# Patient Record
Sex: Female | Born: 1965 | Race: Black or African American | Hispanic: No | Marital: Married | Smoking: Former smoker
Health system: Southern US, Community
[De-identification: ages and names within clinical notes are randomized; demographics above are authoritative.]

## PROBLEM LIST (undated history)

## (undated) DIAGNOSIS — F419 Anxiety disorder, unspecified: Secondary | ICD-10-CM

## (undated) DIAGNOSIS — I499 Cardiac arrhythmia, unspecified: Secondary | ICD-10-CM

## (undated) DIAGNOSIS — R51 Headache: Secondary | ICD-10-CM

## (undated) DIAGNOSIS — R112 Nausea with vomiting, unspecified: Secondary | ICD-10-CM

## (undated) DIAGNOSIS — B009 Herpesviral infection, unspecified: Secondary | ICD-10-CM

## (undated) DIAGNOSIS — D649 Anemia, unspecified: Secondary | ICD-10-CM

## (undated) DIAGNOSIS — Z9889 Other specified postprocedural states: Secondary | ICD-10-CM

## (undated) DIAGNOSIS — N83201 Unspecified ovarian cyst, right side: Secondary | ICD-10-CM

## (undated) DIAGNOSIS — K219 Gastro-esophageal reflux disease without esophagitis: Secondary | ICD-10-CM

## (undated) HISTORY — PX: UNILATERAL SALPINGECTOMY: SHX6160

## (undated) HISTORY — PX: TUBAL LIGATION: SHX77

## (undated) HISTORY — PX: OTHER SURGICAL HISTORY: SHX169

## (undated) HISTORY — PX: ABLATION: SHX5711

## (undated) HISTORY — PX: LEFT OOPHORECTOMY: SHX1961

## (undated) HISTORY — PX: DIAGNOSTIC LAPAROSCOPY: SUR761

## (undated) HISTORY — PX: MYOMECTOMY: SHX85

---

## 1999-02-14 ENCOUNTER — Other Ambulatory Visit: Admission: RE | Admit: 1999-02-14 | Discharge: 1999-02-14 | Payer: Self-pay | Admitting: *Deleted

## 2000-02-28 ENCOUNTER — Other Ambulatory Visit: Admission: RE | Admit: 2000-02-28 | Discharge: 2000-02-28 | Payer: Self-pay | Admitting: Obstetrics and Gynecology

## 2001-01-27 ENCOUNTER — Other Ambulatory Visit: Admission: RE | Admit: 2001-01-27 | Discharge: 2001-01-27 | Payer: Self-pay | Admitting: Obstetrics and Gynecology

## 2002-02-15 ENCOUNTER — Other Ambulatory Visit: Admission: RE | Admit: 2002-02-15 | Discharge: 2002-02-15 | Payer: Self-pay | Admitting: Obstetrics and Gynecology

## 2002-07-11 ENCOUNTER — Encounter: Payer: Self-pay | Admitting: Internal Medicine

## 2002-07-11 ENCOUNTER — Ambulatory Visit (HOSPITAL_COMMUNITY): Admission: RE | Admit: 2002-07-11 | Discharge: 2002-07-11 | Payer: Self-pay | Admitting: Internal Medicine

## 2003-03-08 ENCOUNTER — Other Ambulatory Visit: Admission: RE | Admit: 2003-03-08 | Discharge: 2003-03-08 | Payer: Self-pay | Admitting: Obstetrics and Gynecology

## 2003-03-09 ENCOUNTER — Other Ambulatory Visit: Admission: RE | Admit: 2003-03-09 | Discharge: 2003-03-09 | Payer: Self-pay | Admitting: Obstetrics and Gynecology

## 2003-04-12 ENCOUNTER — Ambulatory Visit (HOSPITAL_BASED_OUTPATIENT_CLINIC_OR_DEPARTMENT_OTHER): Admission: RE | Admit: 2003-04-12 | Discharge: 2003-04-12 | Payer: Self-pay

## 2003-11-13 ENCOUNTER — Encounter (INDEPENDENT_AMBULATORY_CARE_PROVIDER_SITE_OTHER): Payer: Self-pay | Admitting: *Deleted

## 2003-11-13 ENCOUNTER — Inpatient Hospital Stay (HOSPITAL_COMMUNITY): Admission: RE | Admit: 2003-11-13 | Discharge: 2003-11-15 | Payer: Self-pay | Admitting: Gynecology

## 2004-03-11 ENCOUNTER — Other Ambulatory Visit: Admission: RE | Admit: 2004-03-11 | Discharge: 2004-03-11 | Payer: Self-pay | Admitting: Gynecology

## 2005-01-10 ENCOUNTER — Ambulatory Visit: Payer: Self-pay | Admitting: Psychology

## 2005-02-08 ENCOUNTER — Ambulatory Visit: Payer: Self-pay | Admitting: Psychology

## 2005-03-04 ENCOUNTER — Ambulatory Visit: Payer: Self-pay | Admitting: Psychology

## 2005-03-17 ENCOUNTER — Other Ambulatory Visit: Admission: RE | Admit: 2005-03-17 | Discharge: 2005-03-17 | Payer: Self-pay | Admitting: Gynecology

## 2005-06-05 ENCOUNTER — Ambulatory Visit (HOSPITAL_BASED_OUTPATIENT_CLINIC_OR_DEPARTMENT_OTHER): Admission: RE | Admit: 2005-06-05 | Discharge: 2005-06-05 | Payer: Self-pay | Admitting: Gynecology

## 2005-06-05 ENCOUNTER — Ambulatory Visit (HOSPITAL_COMMUNITY): Admission: RE | Admit: 2005-06-05 | Discharge: 2005-06-05 | Payer: Self-pay | Admitting: Gynecology

## 2005-06-06 ENCOUNTER — Encounter (INDEPENDENT_AMBULATORY_CARE_PROVIDER_SITE_OTHER): Payer: Self-pay | Admitting: Specialist

## 2005-09-03 ENCOUNTER — Emergency Department (HOSPITAL_COMMUNITY): Admission: EM | Admit: 2005-09-03 | Discharge: 2005-09-04 | Payer: Self-pay | Admitting: Emergency Medicine

## 2005-09-07 ENCOUNTER — Encounter: Admission: RE | Admit: 2005-09-07 | Discharge: 2005-09-07 | Payer: Self-pay | Admitting: Ophthalmology

## 2005-10-06 ENCOUNTER — Ambulatory Visit: Payer: Self-pay | Admitting: Psychology

## 2005-10-11 ENCOUNTER — Emergency Department (HOSPITAL_COMMUNITY): Admission: EM | Admit: 2005-10-11 | Discharge: 2005-10-11 | Payer: Self-pay | Admitting: Emergency Medicine

## 2005-10-13 ENCOUNTER — Ambulatory Visit: Payer: Self-pay | Admitting: Internal Medicine

## 2005-10-20 ENCOUNTER — Ambulatory Visit: Payer: Self-pay | Admitting: Psychology

## 2005-11-03 ENCOUNTER — Ambulatory Visit: Payer: Self-pay | Admitting: Psychology

## 2006-01-01 ENCOUNTER — Ambulatory Visit: Payer: Self-pay | Admitting: Psychology

## 2006-01-06 ENCOUNTER — Ambulatory Visit: Payer: Self-pay | Admitting: Internal Medicine

## 2006-01-29 ENCOUNTER — Ambulatory Visit: Payer: Self-pay | Admitting: Psychology

## 2006-02-05 ENCOUNTER — Ambulatory Visit: Payer: Self-pay | Admitting: Internal Medicine

## 2006-03-06 ENCOUNTER — Ambulatory Visit: Payer: Self-pay | Admitting: Internal Medicine

## 2006-03-12 ENCOUNTER — Encounter: Admission: RE | Admit: 2006-03-12 | Discharge: 2006-03-12 | Payer: Self-pay | Admitting: Neurology

## 2006-04-02 ENCOUNTER — Other Ambulatory Visit: Admission: RE | Admit: 2006-04-02 | Discharge: 2006-04-02 | Payer: Self-pay | Admitting: Gynecology

## 2006-04-06 ENCOUNTER — Ambulatory Visit: Payer: Self-pay | Admitting: Psychology

## 2006-04-20 ENCOUNTER — Ambulatory Visit: Payer: Self-pay | Admitting: Psychology

## 2006-05-18 ENCOUNTER — Ambulatory Visit: Payer: Self-pay | Admitting: Psychology

## 2006-06-16 ENCOUNTER — Ambulatory Visit: Payer: Self-pay | Admitting: Psychology

## 2006-07-13 ENCOUNTER — Ambulatory Visit: Payer: Self-pay | Admitting: Psychology

## 2006-08-24 ENCOUNTER — Ambulatory Visit: Payer: Self-pay | Admitting: Psychology

## 2006-10-16 ENCOUNTER — Ambulatory Visit: Payer: Self-pay | Admitting: Psychology

## 2006-11-19 ENCOUNTER — Ambulatory Visit: Payer: Self-pay | Admitting: Psychology

## 2006-12-25 ENCOUNTER — Ambulatory Visit: Payer: Self-pay | Admitting: Psychology

## 2007-02-25 ENCOUNTER — Ambulatory Visit: Payer: Self-pay | Admitting: Psychology

## 2007-03-16 ENCOUNTER — Encounter: Admission: RE | Admit: 2007-03-16 | Discharge: 2007-03-16 | Payer: Self-pay | Admitting: Neurology

## 2007-03-26 ENCOUNTER — Ambulatory Visit: Payer: Self-pay | Admitting: Psychology

## 2007-04-07 ENCOUNTER — Other Ambulatory Visit: Admission: RE | Admit: 2007-04-07 | Discharge: 2007-04-07 | Payer: Self-pay | Admitting: Gynecology

## 2007-05-20 ENCOUNTER — Ambulatory Visit: Payer: Self-pay | Admitting: Psychology

## 2007-06-18 ENCOUNTER — Ambulatory Visit: Payer: Self-pay | Admitting: Psychology

## 2007-08-19 ENCOUNTER — Ambulatory Visit (HOSPITAL_BASED_OUTPATIENT_CLINIC_OR_DEPARTMENT_OTHER): Admission: RE | Admit: 2007-08-19 | Discharge: 2007-08-19 | Payer: Self-pay | Admitting: Gynecology

## 2007-08-19 ENCOUNTER — Encounter: Payer: Self-pay | Admitting: Gynecology

## 2007-08-24 ENCOUNTER — Ambulatory Visit: Payer: Self-pay | Admitting: Psychology

## 2007-10-05 ENCOUNTER — Ambulatory Visit: Payer: Self-pay | Admitting: Psychology

## 2007-12-13 ENCOUNTER — Ambulatory Visit: Payer: Self-pay | Admitting: Psychology

## 2008-03-20 ENCOUNTER — Other Ambulatory Visit: Admission: RE | Admit: 2008-03-20 | Discharge: 2008-03-20 | Payer: Self-pay | Admitting: Gynecology

## 2008-03-22 ENCOUNTER — Ambulatory Visit (HOSPITAL_BASED_OUTPATIENT_CLINIC_OR_DEPARTMENT_OTHER): Admission: RE | Admit: 2008-03-22 | Discharge: 2008-03-22 | Payer: Self-pay | Admitting: Gynecology

## 2008-03-22 ENCOUNTER — Encounter: Payer: Self-pay | Admitting: Gynecology

## 2008-08-24 ENCOUNTER — Encounter: Admission: RE | Admit: 2008-08-24 | Discharge: 2008-11-22 | Payer: Self-pay | Admitting: Obstetrics and Gynecology

## 2008-10-20 ENCOUNTER — Encounter: Admission: RE | Admit: 2008-10-20 | Discharge: 2008-10-20 | Payer: Self-pay | Admitting: Internal Medicine

## 2009-02-23 ENCOUNTER — Ambulatory Visit: Payer: Self-pay | Admitting: Psychology

## 2010-12-28 ENCOUNTER — Encounter: Payer: Self-pay | Admitting: Ophthalmology

## 2011-01-03 LAB — CBC
HCT: 34.2 % — ABNORMAL LOW (ref 36.0–46.0)
Hemoglobin: 10.9 g/dL — ABNORMAL LOW (ref 12.0–15.0)
MCH: 26.3 pg (ref 26.0–34.0)
MCHC: 31.9 g/dL (ref 30.0–36.0)
Platelets: 276 10*3/uL (ref 150–400)
RBC: 4.15 MIL/uL (ref 3.87–5.11)
RDW: 16.2 % — ABNORMAL HIGH (ref 11.5–15.5)
WBC: 4.9 10*3/uL (ref 4.0–10.5)

## 2011-01-10 ENCOUNTER — Ambulatory Visit (HOSPITAL_COMMUNITY)
Admission: RE | Admit: 2011-01-10 | Discharge: 2011-01-10 | Disposition: A | Discharge status: Home / Self Care | Payer: BC Managed Care – PPO | Source: Ambulatory Visit | Attending: Obstetrics and Gynecology | Admitting: Obstetrics and Gynecology

## 2011-01-10 ENCOUNTER — Other Ambulatory Visit: Payer: Self-pay | Admitting: Obstetrics and Gynecology

## 2011-01-10 DIAGNOSIS — N938 Other specified abnormal uterine and vaginal bleeding: Secondary | ICD-10-CM | POA: Insufficient documentation

## 2011-01-10 DIAGNOSIS — N949 Unspecified condition associated with female genital organs and menstrual cycle: Secondary | ICD-10-CM | POA: Insufficient documentation

## 2011-01-10 DIAGNOSIS — N920 Excessive and frequent menstruation with regular cycle: Secondary | ICD-10-CM | POA: Insufficient documentation

## 2011-01-10 DIAGNOSIS — N840 Polyp of corpus uteri: Secondary | ICD-10-CM | POA: Insufficient documentation

## 2011-01-15 NOTE — Op Note (Signed)
  [name], Kristina [name]              ACCOUNT NO.:  [id number]  MEDICAL RECORD NO.:  [id number]           PATIENT TYPE:  O  LOCATION:  [facility]                          FAC[facility]  PHYSICIAN:  Maxie Better, M.D.DATE OF BIRTH:  06-Feb-1966  DATE OF PROCEDURE:  01/10/2011 DATE OF DISCHARGE:                              OPERATIVE REPORT   PREOPERATIVE DIAGNOSES:  Dysfunctional uterine bleeding/menorrhagia, endometrial mass.  PROCEDURES:  Diagnostic hysteroscopy, D and C, removal of endometrial polyp, NovaSure endometrial ablation.  POSTOPERATIVE DIAGNOSIS:  Dysfunctional uterine bleeding/menorrhagia, endometrial polyp.  ANESTHESIA:  General, paracervical block.  SURGEON:  Maxie Better, MD  ASSISTANT:  None.  PROCEDURE IN DETAIL:  Under adequate general anesthesia, the patient was placed in the dorsal lithotomy position.  She was sterilely prepped and draped in usual fashion.  The bladder was catheterized to a small amount of urine.  Examination under anesthesia revealed anteverted uterus.  No adnexal masses could be appreciated.  The patient had surgical absence of a left ovary and tube.  Bivalve speculum was placed in the vagina.  A single-tooth tenaculum was placed on the anterior lip of the cervix.  A 20 mL of 1% Nesacaine was injected paracervical at the 3 and 9 o'clock positions.  The uterus was then sounded to 9 cm.  The endocervical canal sounded to 4 cm, which showed the cavity length of 5.  The cervix was then gently dilated and diagnostic hysteroscope was inserted.  Once that was inserted, very large polypoid mass was arising from the left cornual region was noted.  The right cornual area was fine with the tubal ostia seen.  No other lesions were noted.  The diagnostic hysteroscope was removed.  A polyp forceps was then used and at three passes, the polyp was removed and a small base was noted by reinsertion of the hysteroscope.  At that point, the NovaSure  endo ablation apparatus was inserted in the uterus without incident.  The width was subsequently noted to be 3 cm, ablation then occurred for 1 minute and 45 seconds at a power of 83.  The NovaSure apparatus was then removed.  The hysteroscope was reinserted.  Great endometrial ablation was noted.  At that point, all instruments were then removed from the vagina.  Specimen was the endometrial polyps and endometrial curettings sent to Pathology.  Estimated blood loss was minimal.  Fluid deficit was 80 mL.  Sponge and instrument counts x2 was correct.  Complication was none.  The patient tolerated procedure well, was transferred to recovery in stable condition.    Maxie Better, M.D.    [place]/MEDQ  D:  01/10/2011  T:  01/11/2011  Job:  [id number]  Electronically Signed by Nena Jordan Kimber Fritts M.D. on 01/15/2011 07:57:06 AM

## 2011-04-22 NOTE — Op Note (Signed)
NAME:  Kristina [name], Kristina [name]      ACCOUNT NO.:  [id number]   MEDICAL RECORD NO.:  [id number]          PATIENT TYPE:  AMB   LOCATION:  [facility]                         FACILITY:  [facility]   PHYSICIAN:  Juan H. Lily Peer, M.D.DATE OF BIRTH:  July 04, 1966   DATE OF PROCEDURE:  08/19/2007  DATE OF DISCHARGE:                               OPERATIVE REPORT   SURGEON:  Juan H. Lily Peer, M.D.   INDICATIONS FOR OPERATION:  A 45 year old gravida 0 with dysfunctional  bleeding.  Preoperative evaluation consisted of a sonohysterogram  demonstrating endometrial polyp.  The patient also had a benign  endometrial biopsy.   PREOPERATIVE DIAGNOSES:  1. Dysfunctional uterine bleeding.  2. Endometrial polyps/submucous myoma.   POSTOPERATIVE DIAGNOSES:  1. Dysfunctional uterine bleeding.  2. Endometrial polyps/submucous myoma.   ANESTHESIA:  General endotracheal anesthesia.   PROCEDURE PERFORMED:  Resectoscopic polypectomy/submucous myomectomy.   FINDINGS:  The patient had an intracavitary submucous polyp/myoma  encroaching three fourths of the cavity with attachment to the upper  third of the uterus on the patient's left side.   DESCRIPTION OF OPERATION:  After the patient was adequately counseled,  she was taken to the operating room where she underwent a successful  general endotracheal anesthesia.  She had received a gram of cephalexin  for prophylaxis and had PAS stockings for DVT prophylaxis.  Once in the  operating room, her legs were set up in the high lithotomy position, the  vagina and perineum were prepped and draped in the usual sterile  fashion.  A red rubber Roxan Hockey was utilized to evacuate the bladder of  its contents for a total of 50 mL.  Bimanual examination demonstrated  upper limits of normal anteverted uterus.  No palpable adnexal masses.  A Graves speculum was inserted into the vaginal vault.  The anterior  cervical lip was grasped with a single-tooth tenaculum.  A previously  placed laminaria was removed from the cervix, thus requiring minimal  cervical dilatation.  The uterus sounded to approximately 10 cm.  With  the Lendell Caprice operative resectoscope with a 90-degree wire loop  attached, it was introduced into the uterine cavity.  A 3% sorbitol was  used as a distending media.  A systematic inspection of the cavity  demonstrated normal cervical canal, normal lower uterine segment, and  polypoid/myoma with a broad attachment to the upper portion of the  uterus on the patient's left side wall and was encroaching three fourths  of the uterus.  The left and right tubal ostia were identified after  manipulating the scope around this mass.  With the [facility]  surgical generator set at 70 watts on cutting and 70 on coagulation, the  pedicle was cauterized, transected from its base, and the remaining  fragments of the submucous polyp/myoma were resected and submitted for  histological evaluation.  The base was cauterized.  Systematic  inspection of the cavity demonstrated a clear cavity with a section of a  small indentation at the fundus, probably a small septum or possibly as  part of an arcuate uterus, but otherwise the rest of the cavity looked  normal.  The patient tolerated the procedure  well, was extubated,  and transferred to recovery room with stable vital signs.  Sorbitol,  intrauterine fluid deficit was 250 mL.  IV fluids consisted of a liter  of lactated Ringer's and estimated blood loss was minimal.  The patient  received 30 mg of Toradol en route to the recovery room.      Juan H. Lily Peer, M.D.  Electronically Signed     JHF/MEDQ  D:  08/19/2007  T:  08/19/2007  Job:  [id number]

## 2011-04-22 NOTE — Op Note (Signed)
NAME:  Kristina [name], Kristina [name]      ACCOUNT NO.:  [id number]   MEDICAL RECORD NO.:  [id number]          PATIENT TYPE:  AMB   LOCATION:  [facility]                         [facility]ACILITY:  [facility]   PHYSICIAN:  Juan H. Lily Peer, M.D.DATE OF BIRTH:  May 06, 1966   DATE OF PROCEDURE:  03/22/2008  DATE OF DISCHARGE:                               OPERATIVE REPORT   INDICATIONS FOR OPERATION:  A 45 year old gravida 0 with longstanding  history of chronic left lower quadrant pain.  The patient with prior  history of endometriosis with previous left ovarian cystectomy and  ablation of endometriotic implant and previous hormonal manipulation for  chronic pelvic pain and endometriosis.  The patient is requesting  definitive surgery, such as removal of her left tube and ovary and also  has now decided she would like to have her contralateral fallopian tube  sterilized.   PREOPERATIVE DIAGNOSES:  1. Chronic left lower quadrant pain.  2. Elective sterilization.  3. Endometriosis.   POSTOPERATIVE DIAGNOSES:  1. Pelvic adhesions.  2. Questionable endometriosis of left ovary.   ANESTHESIA:  General endotracheal anesthesia.   SURGEON:  Juan H. Lily Peer, M.D.   FIRST ASSISTANT:  Colin Broach, MD   PROCEDURE PERFORMED:  1. Diagnostic laparoscopy.  2. Pelvic adhesiolysis.  3. Left salpingo-oophorectomy.  4. Segmental resection of right fallopian tube.   FINDINGS:  1. Anterior uterine/ peritoneal adhesions.  2. Left pelvic adhesions.  3. Left ovary adhered to the left pelvic sidewall.  4. Normal fallopian tubes bilateral.  5. Normal right ovary.   DESCRIPTION OF OPERATION:  After the patient was adequately counseled,  she was taken to the operating room where she underwent a successful  general endotracheal anesthesia.  She had received one gram of cefoxitin  for prophylaxis.  She also had PSA stockings for DVT prophylaxis.  After  general endotracheal anesthesia was obtained, her abdomen was  prepped  and draped in the usual sterile fashion.  Foley catheter had been  inserted to monitor urinary output and a Hulka tenaculum was placed  after bimanual examination demonstrated a normal anteverted uterus with  no palpable adnexal masses.  Once the drapes were in place, a small  subumbilical incision was made followed by insertion of the OptiVu  trocar with scope attachments to gain entrance in the peritoneal cavity.  The trocar was removed.  The scope was reinserted.  The abdomen was  inflated with CO2 to approximately 3 liters of carbon-di-oxide and 2  additional ports were made approximately 4 to 5 fingerbreadths in the  midline in the lower abdomen bilaterally under laparoscopic guidance for  about 5 mm trocars were inserted.  A systematic pelvic inspection  demonstrated the above-mentioned findings.  Attention was then placed to  the adhered left tube and ovary required meticulous dissection with the  use of the harmonic scalpel.  The left ureter was identified.  The  harmonic scalpel was utilized.  The left fallopian tube and utero-  ovarian ligament were coapted and transected with harmonic scalpel and  the left infundibulopelvic ligament was grasped, coapted, and transected  with the harmonic scalpel to meet the contralateral side coapting and  transecting  the peritoneum to free the left tube and ovary.  At all  times keeping in view, the left ureter which continued to have good  peristalsis.  Attention was then placed to the right fallopian tube.  The ovary is with normal surface.  No abnormality noted.  A 2-cm segment  of the right fallopian tube was coaptated, transected with the harmonic  scalpel.  Both the specimens were retrieved with the laparoscopic  Endopouch and passed off the operative field for proper identification  and submitted separately for histological evaluation.  The trocar was  reinserted and the scope was inserted to visualize the remaining pelvic   cavity.  It was noted that there was pelvic adhesions from the anterior  abdominal wall to the anterior uterine surface, which were coaptated and  transected with the harmonic scalpel to free and perhaps alleviate at  least most of the discomfort the patient had been experiencing.  There  was no other abnormality noted in the cul-de-sac, the liver and  gallbladder appeared normal on gross inspection.  The appendix was not  able to be visualized, it was poly retrocecal.  The pelvic cavity was  copiously irrigated with normal saline solution.  Pictures pre and post  procedures were undertaken and was to be shown to the patient on her  postop visit.  The carbon-di-oxide was removed from the abdominal,  pelvic cavity.  The instruments were removed.  The subumbilical fascial  incision was closed with a single figure-of-eight of 3-0 Vicryl suture  and the skin was re-approximated with 4-0 Vicryl suture subcuticular.  Two sutures were placed one on each 5 mm trocar site with the 3-0 Vicryl  suture.  A 0.25% Marcaine was infiltrated in the all 3 incisions sites  for postoperative analgesia for a total 10 mL.  The Hulka tenaculum was  removed and the inspection of the anterior cervical lip had some mild  oozing, which was contained with silver nitrate.  The patient was  extubated, transferred to recovery room with stable vital signs.  Blood  loss during procedure was minimal.  Fluid resuscitation consisted of 650  mL of lactated Ringer's.  Urine output was 150 cc and clear and she was  to receive Toradol 30 mg IV en route to the recovery room.      Juan H. Lily Peer, M.D.  Electronically Signed     JHF/MEDQ  D:  03/22/2008  T:  03/22/2008  Job:  [id number]

## 2011-04-22 NOTE — H&P (Signed)
NAME:  Kristina [name], Kristina [name]      ACCOUNT NO.:  [id number]   MEDICAL RECORD NO.:  [id number]          PATIENT TYPE:  AMB   LOCATION:  [facility]                         FACILITY:  [facility]   PHYSICIAN:  Juan H. Lily Peer, M.D.DATE OF BIRTH:  23-Aug-1966   DATE OF ADMISSION:  03/22/2008  DATE OF DISCHARGE:                              HISTORY & PHYSICAL   HISTORY & PHYSICAL:  The patient scheduled for surgery on Wednesday,  April 15, at [facility] at 8:30 a.m., please have  history and physical available.   CHIEF COMPLAINT:  Chronic left lower quadrant pain.   HISTORY OF PRESENT ILLNESS:  The patient is 45 year old gravida 0 with  longstanding history of chronic left lower quadrant pain.  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  She did receive  several cycles of Lupron 11.25 mg IM an effort to curtail her  symptomatology.  She now wants to proceed with definitive surgery and  wants to have her left tube and ovary removed and treatment of any other  underlying endometriosis noted at the time.  She was originally  scheduled a few weeks a time, but due to upper respiratory tract  infection, her surgery had to be cancelled.   PAST MEDICAL HISTORY:  She is allergic to tetracycline, history of  endometriosis, history of HSV 2.   Medications consist of Valtrex p.r.n., Lexapro for depression, Ambien CR  12.5 for insomnia, and other medical problems are ocular migraines.   PAST SURGICAL HISTORY:  She has had urethral diverticulum.  She has had  abdominal myomectomy, she has had laparoscopic left ovarian cystectomy,  and ablation of endometriosis in June 2006 and she has had a  resectoscope polypectomy in September 2008.   FAMILY HISTORY:  Mother with hypertension and with breast cancer and  father with history of  diabetes mellitus.   PHYSICAL EXAMINATION:  GENERAL:  A Well-developed, well nourished female  in no apparent acute distress.  Currently, weight is approximately 127  pounds.  HEENT: Unremarkable.  NECK: Supple.  Trachea Midline.  No carotid bruits, no thyromegaly.  LUNGS: Clear to auscultation without any rhonchi or wheezes.  HEART: Regular rate and rhythm.  No murmurs or gallop.  BREAST EXAM:  Done in the sitting and supine position, both the breasts  are symmetrical in appearance.  SKIN: No discoloration, nipple inversion.  No palpable mass or  tenderness.  ABDOMEN: Soft and nontender.  No rebound or guarding.  PELVIC: Bartholin, urethra, and skene is within normal limits.  VAGINA AND CERVIX: No gross lesions on inspection.  UTERUS: Anteverted, normal size, shape, and consistency.  ADNEXA: Slight tenderness on the left adnexa, but no discernible mass.  RECTAL EXAM: Unremarkable.  Most recent ultrasound done on March 5 was  essentially unremarkable.   ASSESSMENT:  A 45 year old gravida 0 with chronic left lower quadrant  pain history of endometriosis and ablation of left ovarian cyst in the  past.  The patient would like to proceed with definitive  surgery.  She  is scheduled to undergo diagnostic laparoscopy with ablation of any  underlying endometriotic implants adhesiolysis of  any pelvic or  abdominal pain adhesions seen and plan on removal of her left tube and  ovary as per request.  Will also talk with her right before the surgery.  At the time wants to go ahead and cauterize for contralateral fallopian  tubes and she is now 45 years of age for contraception.  If so, and she  gives full authorization, she is fully aware that she will not be able  have children.  Risk of the operation to include infection, she will  receive prophylactic antibiotics, the risk for trauma to internal organs  requiring open laparotomy, and corrective surgical repair was discussed  also the risk of  blood clots and subsequent pulmonary embolism was  discussed.  PSA stockings will be utilized for DVT prophylaxis.  All  these issues were discussed with the patient in detail.  All questions  were answered and will follow accordingly.   PLAN:  The patient is scheduled for diagnostic laparoscopy with left  salpingo-oophorectomy, possible right tubal sterilization on Wednesday  April 15 at 08:30 a.m. in [facility].  Please have  history and physical available.      Juan H. Lily Peer, M.D.  Electronically Signed     JHF/MEDQ  D:  03/21/2008  T:  03/21/2008  Job:  [id number]

## 2011-04-25 NOTE — H&P (Signed)
[name], [name]              ACCOUNT NO.:  [id number]   MEDICAL RECORD NO.:  [id number]          PATIENT TYPE:  AMB   LOCATION:  [facility]                         FACILITY:  [facility]   PHYSICIAN:  Juan H. Lily Peer, M.D.DATE OF BIRTH:  [birth year]   DATE OF ADMISSION:  DATE OF DISCHARGE:                                HISTORY & PHYSICAL   DATE OF SCHEDULED SURGERY:  June 05, 2005 at 1 p.m. in [facility].   CHIEF COMPLAINT:  Chronic left lower quadrant pain.   HISTORY:  The patient is a 45 year old gravida 0 who was seen in the office  on June 03, 2005 for a preoperative consultation, as well as for follow-up  on the ultrasound secondary to her history of persistent left ovarian cyst.  The patient in 2004 had 18 fibroids removed/multiple myomectomy which were  benign. At the time of her surgery, peritoneal biopsy demonstrated she had  endometriosis. She has continued to have chronic left lower quadrant pain  and dyspareunia despite being on oral contraceptive pills such as Ortho Tri-  Cyclen Lo. The patient is interested in maintaining her fertility although  she is not married and will be 45 years of age by August of this year. The  ultrasound had demonstrated a complex cyst measuring 15 x 14 mm, thin smooth  wall with homogeneous echoes, and it was avascular. No change in appearance  from previous exam. All along this has been suspicious for an endometrioma.  The patient is scheduled to undergo diagnostic laparoscopy with left ovarian  cystectomy with possible left oophorectomy and possible left salpingo-  oophorectomy later today.   PAST MEDICAL HISTORY:  She is allergic to TETRACYCLINE. She has had a  history of endometriosis, history of HSV 2 for which she takes Valtrex on a  p.r.n. basis, using Ortho Tri-Cyclen Lo. Surgeries have consisted of  urethral diverticulum, also abdominal myomectomy.   FAMILY HISTORY:  Mother with hypertension and aunt with breast  cancer.   PHYSICAL EXAMINATION:  GENERAL:  Well-developed, well-nourished female.  HEENT:  Unremarkable.  NECK:  Supple, trachea midline. No carotid bruits, no thyromegaly.  LUNGS:  Clear to auscultation without rhonchi or wheezes.  HEART:  Regular rate and rhythm, no murmurs or gallops.  BREASTS:  Not examined.  ABDOMEN:  Soft, nontender, without rebound or guarding.  PELVIC:  Bartholin/urethra/Skene glands within normal limits. Vagina and  cervix:  No lesion or discharge. Uterus upper limits of normal, no palpable  masses or tenderness. Slight tenderness of the left adnexa but no guarding.  RECTAL:  Deferred.   ASSESSMENT:  A 45 year old gravida 0 with chronic left lower quadrant pain,  persistent small left ovarian cyst suspicious for endometrioma. The patient  with prior history of endometriosis. In 2004 she had 18 subserosal and  intramural myomas removed. Possible etiology for her pain besides  endometriosis could be pelvic adhesions. She is wishing to retain her  fertility. She is scheduled to undergo a diagnostic laparoscopy with  possible left cystectomy, possible left oophorectomy, and possible left  salpingo-oophorectomy. The risks, benefits, and pros and cons of  the  operation were discussed. We discussed potential risk of infection,  bleeding, trauma to internal organs, as well as potential for laparotomy to  proceed with the operation in the event technical difficulties encountered  or in the event of trauma during the laparoscopic procedure. The patient had  previously been presented with literature information from the [employer] of [facility] on laparoscopic surgery as well as on endometriosis.   PLAN:  The patient scheduled for: A. Diagnostic laparoscopy, and B. Left  ovarian cystectomy on Thursday, June 05, 2005 at 1 p.m. at [facility].       JHF/MEDQ  D:  06/05/2005  T:  06/05/2005  Job:  [id number]

## 2011-04-25 NOTE — Op Note (Signed)
[name], [name]                        ACCOUNT NO.:  [id number]   MEDICAL RECORD NO.:  [id number]                   PATIENT TYPE:  INP   LOCATION:  9307                                 FACILITY:  [facility]   PHYSICIAN:  Juan H. Lily Peer, M.D.             DATE OF BIRTH:  05/03/1966   DATE OF PROCEDURE:  11/13/2003  DATE OF DISCHARGE:                                 OPERATIVE REPORT   PREOPERATIVE DIAGNOSIS:  Symptomatic leiomyoma uteri.   POSTOPERATIVE DIAGNOSIS:  Symptomatic leiomyomata uteri.   ANESTHESIA:  General endotracheal anesthesia.   SURGEON:  Juan H. Lily Peer, M.D.   FIRST ASSISTANT:  Ivor Costa. Farrel Gobble, M.D.   PROCEDURES PERFORMED:  Abdominal myomectomy.   INDICATION FOR OPERATION:  A 45 year old gravida 0 with symptomatic  leiomyomata uteri.   FINDINGS:  Leiomyoma uteri (18 fibroids, subserosal and intramural and  submucosal).  Normal tubes and ovaries.  Endometriotic plaque was noted  anterior to the bladder fold.  Chromopertubation demonstrated only tubal  patency on the left although both tubes appeared to be healthy with lush  fimbriated ends and no other abnormality was noted.   DESCRIPTION OF OPERATION:  After the patient was adequately counseled she  was taken to the operating room, where she underwent a successful general  endotracheal anesthesia.  She had received 2 g of Mefoxin for prophylaxis  antibiotic, pneumatic compression stockings were in place.  She was placed  in the supine position after a Foley catheter had been placed for monitoring  of urinary output.  The abdomen was prepped and draped in the usual sterile  fashion.  A Pfannenstiel skin incision was made 2 cm above the symphysis  pubis.  The incision was carried down from the skin and subcutaneous tissue  down to the rectus fascia, whereby a midline nick was made.  The fascia was  incised in a transverse fashion.  The peritoneal cavity was entered  cautiously.  The O'Connor-O'Sullivan  retractor was in place after the  patient had been placed in Trendelenburg position and the bowel had been  packed in a cephalic fashion with the use of plastic-wrapped gauze to  minimize manipulation and decrease the incidence of postoperative adhesions.  Inspection of the pelvic cavity demonstrated multiple subserosal and  intramural leiomyomas and both tubes appeared to be with lush fimbriated  ends.  Methylene blue was instilled into the intrauterine cavity, in which a  pediatric Foley catheter had been inserted during the time of the  preparation.  Methylene blue dye spilled in the left fallopian tube but  minimal on the right.  Both tubes appeared to be normal in caliber and  shape.  There were no adhesions, no endometriosis.  The only endometriotic  implant was noted, a small plaque that was noted anterior to the uterus near  the lower uterine segment and bladder fold, which was excised and passed for  histologic evaluation.  Pitressin 1 unit/mL  of normal saline, for a total of  15 mL was utilized to infiltrate the uterine serosa and myometrium in the  fundal aspect of the uterus.  An incision was made in the fundal aspect of  the uterus and at this point all these fibroids were able to be removed in  one single incision.  At was at this point that the cavity had been entered,  and it was noted that she had submucosal myoma, which was also removed.  Once all the submucosal, intramural, and subserosal leiomyomas were removed,  the intrauterine Foley catheter was removed and the endometrium was  reapproximated with a running stitch of 3-0 Vicryl suture at the fundal  aspect to close the defect.  The remainder of the myometrium was closed with  a running stitch of 3-0 Vicryl suture, and the serosa was closed with a 3-0  running imbricating stitch of 3-0 Vicryl suture.  The pelvic cavity was  copiously irrigated with normal saline solution and Interceed was placed on  the anterior  bladder fold, where the endometriotic plaque had been removed  as well as on the uterine fundus.  Sponge count and needle count was  correct.  The O'Connor-O'Sullivan retractor was removed.  The visceral  peritoneum was not reapproximated, but the rectus fascia was closed with a  running stitch of 0 Vicryl suture.  The subcutaneous bleeders were Bovie  cauterized.  The skin was reapproximated with skin clips, followed by  placement of Xeroform gauze with 4 x 8 dressing.  The patient was extubated  and transferred to the recovery room with stable vital signs.  Blood loss  from the procedure was 100 mL.  IV fluids were 1300 mL of lactated Ringer's.  Urine output was 350 mL.                                               Juan H. Lily Peer, M.D.    JHF/MEDQ  D:  11/13/2003  T:  11/14/2003  Job:  [id number]

## 2011-04-25 NOTE — Op Note (Signed)
[name], [name]              ACCOUNT NO.:  [id number]   MEDICAL RECORD NO.:  [id number]          PATIENT TYPE:  AMB   LOCATION:  [facility]                         FACILITY:  [facility]   PHYSICIAN:  Juan H. Lily Peer, M.D.DATE OF BIRTH:  Jan 11, 1966   DATE OF PROCEDURE:  06/05/2005  DATE OF DISCHARGE:                                 OPERATIVE REPORT   SURGEON:  Juan H. Lily Peer, M.D.   FIRST ASSISTANT:  Daniel L. Eda Paschal, M.D.   INDICATIONS FOR OPERATION:  A 45 year old, gravida 0 with chronic left lower  quadrant pain, persistent and left ovarian cyst suspicious for endometrioma.  Patient with history in the past of multiple myomectomies and was diagnosis  as well with endometriosis   PREOPERATIVE DIAGNOSIS:  1.  Chronic left lower quadrant pain.  2.  Left ovarian cyst.   POSTOPERATIVE DIAGNOSIS:  1.  Left endometrioma.  2.  Utero-ovarian adhesions.  3.  Endometriosis.   ANESTHESIA:  General endotracheal anesthesia.   PROCEDURE PERFORMED:  1.  Diagnostic laparoscopy.  2.  Lysis of utero-ovarian adhesions.  3.  Left ovarian cystectomy.  4.  Ablation of endometriotic implants of uterine serosa and surface of the      ovary.   FINDINGS:  Patient with adhered left ovary to the left pelvic sidewall with  utero-ovarian adhesions, scattered endometriotic implants on the left  ovarian capsule as well as on the left utero-ovarian ligament, also  adhesions anterior uterine wall with scattered endometriotic implants also  endometriosis was noted on several regions of the posterior fundal uterus  serosa and also small endometriotic implant on the right ovary. Both tubes  with lush fimbriated end. The cul-de-sac, uterosacral ligaments were free of  adhesions.   DESCRIPTION OF OPERATION:  After the patient was adequately counseled, she  was taken to the operating room where she underwent a successful general  endotracheal anesthesia. She was placed in the low lithotomy position,  the  abdomen, vagina and perineum were prepped and draped in the usual sterile  fashion. Prior to this, a bimanual examination demonstrated anteverted  uterus and the Hulka tenaculum was placed for manipulation during the  laparoscopic procedure. A small Pfannenstiel incision was made underneath  the umbilicus followed by insertion of the Veress needle. Opening abdominal  pressure was 8 mmHg. Approximately 1/2 liter of carbon dioxide were  insufflated in the peritoneal cavity, the Veress needle was removed. A 10 mm  trocar was inserted and approximately 2 1/2 liter of carbon dioxide were  into the peritoneal cavity. Two additional 5 mm ports were made under  laparoscopic guidance approximately 4-5 fingerbreadth's in the midline and  the lower abdomen. After the above-mentioned findings, attention was then  placed to the left utero-ovarian ligament which was identified and grasped,  placed under tension and with meticulous dissection the left ovary was freed  from the left pelvic sidewall where there was endometriotic implants and  inflammation, hemosiderin deposit which was cleared. The endometrioma  started extruding chocolate like material and was irrigated and a portion of  the capsule was submitted after the cystectomy was made with the  endoshears.  The anterior uterine fold endometriotic implant was meticulously peeled off  from the peritoneal surface. The  posterior fundal fibrinous hemosiderin  like adhesions were excised and passed off for histological evaluation and  scattered endometriotic implants on the serosa of the uterus were cauterized  with a Kleppinger forceps. The right ovary was identified, the utero-ovarian  ligament was grasped and was rotated in an effort to visualize the  undersurface of the ovary. A small endometriotic implant was noted and was  cauterized. The uterosacral ligaments was clear. The pelvic cavity was  copiously irrigated with normal saline solution.  Interceed was placed on the  anterior vesicouterine fold as well as under the undersurface of the left  ovary were the endometrioma had been slightly adhered to the left pelvic  sidewall, and also Interceed was placed in the serosa of the uterus as well  after the whole region was copiously irrigated with normal saline solution.  The carbon dioxide was removed from the pelvic cavity. The instruments were  removed. The subumbilical fascial incision was closed with a figure-of-eight  0 Vicryl suture and all three port sites were closed with Dermabond glue and  0.25% Marcaine was infiltrated in all three incision port sites for  postoperative analgesia. The Hulka tenaculum was removed, silver nitrate was  applied to the anterior cervical lip that was bleeding very minimally. The  patient was extubated and transferred to the recovery room with stable vital  signs. Blood loss was 500 mL of lactated Ringer's and she had received a  gram of cefotetan IV prophylactically.       JHF/MEDQ  D:  06/05/2005  T:  06/05/2005  Job:  [id number]

## 2011-04-25 NOTE — H&P (Signed)
Kristina [name], Kristina [name]                        ACCOUNT NO.:  [id number]   MEDICAL RECORD NO.:  [id number]                   PATIENT TYPE:  INP   LOCATION:  NA                                   FACILITY:  [facility]   PHYSICIAN:  Juan H. Lily Peer, M.D.             DATE OF BIRTH:  31-Aug-1966   DATE OF ADMISSION:  11/13/2003  DATE OF DISCHARGE:                                HISTORY & PHYSICAL   CHIEF COMPLAINT:  Symptomatic leiomyomata uteri consisting of pelvic pains  and menorrhagia.   HISTORY:  The patient is a 45 year old gravida 0 who was seen in the office  as a new patient for a second opinion on June 23, 2003.  She had been  complaining of 1 year history of pelvic pain, had seen another practitioner  in town and had demonstrated by an ultrasound that she had like an 8 to 10  weeks size uterus, she also had what appeared by history to have some form  of a ruptured cyst and she continued to have left lower quadrant pain and  dyspareunia and heavy periods.  She had an ultrasound at [facility] office on October 30, 2003 which demonstrated her uterus  measured 10.5 x 8.0 x 5.5 cm with multiple myomas measuring 32 x 33 mm, 49 x  44 mm, 24 x 22 mm, and 36 x 32 mm.  Right and left ovary were normal, there  was no fluid in the cul-de-sac in the frontal region of the uterus, these  fibroids appeared to be in a cluster-like formation.  Patient was placed on  iron supplementation because of iron-deficiency anemia and was able to  donate 1 unit of autologous blood.  Patient is contemplating getting married  next year and wanted to go ahead and take care of these fibroids before  pregnancy.  She is currently on Ortho Tri-Cyclen low oral contraceptive  pill.   PAST MEDICAL HISTORY:  She is allergic to TETRACYCLINE.  She is currently on  Ortho Tri-Cyclen low.  She was instructed to discontinue after completion of  this pack so she would not be on OCP at the time of her surgery.  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   FAMILY HISTORY:  Mother with hypertension and an aunt with breast cancer.   PHYSICAL EXAMINATION:  VITAL SIGNS:  Patient weighed 127 pounds, height 5  feet 2 inches tall, blood pressure 108/64.  HEENT:  Unremarkable.  NECK:  Neck was supple, trachea midline.  No carotid bruits, no thyromegaly.  LUNGS:  Clear to auscultation without any rhonchi or wheezes.  HEART:  Regular rate and rhythm.  No murmurs or gallops.  BREAST:  Exam was not done.  ABDOMEN:  Soft, nontender, without rebound or guarding.  PELVIC:  Bartholin, urethral, and Skene glands within normal limits.  Vagina  and cervix without any gross lesions on inspection.  Uterus  approximately 8  weeks size.  No palpable adnexal masses.  RECTAL:  Exam was deferred.   ASSESSMENT:  [age]-year-old gravida 0 with symptomatic fibroids, iron  deficiency, pelvic pressure will undergo abdominal myomectomy at [facility] on Monday, November 13, 2003.  She has donated 1 unit of autologous  blood.  Risks of the operation were discussed and include hemorrhage  requiring additional blood besides her autologous blood that she had  donated, in the event that donor blood needs to be utilized she is fully  aware of the risk of anaphylactic reaction, hepatitis and acquired  immunodeficiency syndrome, also the risk for deep vein thrombosis and  pulmonary embolism, we also discussed the risk for infection, she will  receive antibiotic for prophylaxis and also pneumatic compression stockings  for deep vein thrombosis prophylaxis, also there is a risk of intra-  abdominal trauma to internal organs requiring corrective surgery at that  point and also it is a lifesaving measure in the event of uncontrolled  hemorrhage, she is fully aware that she could lose her reproductive organs.  All of these issues were discussed in detail, literature previously had been  provided from the [employer] [facility] for the patient to read, all  questions were answered and we will follow accordingly.   PLAN:  Patient scheduled for abdominal myomectomy and chromopertubation on  Monday, November 13, 2003 at 7:30 a.m. at [facility].  Please have  history and physical available.                                               Juan H. Lily Peer, M.D.    JHF/MEDQ  D:  11/10/2003  T:  11/10/2003  Job:  [id number]

## 2011-04-25 NOTE — Discharge Summary (Signed)
NAMEEBONYE, READE                        ACCOUNT NO.:  [id number]   MEDICAL RECORD NO.:  [id number]                   PATIENT TYPE:  INP   LOCATION:  9307                                 FACILITY:  [facility]   PHYSICIAN:  Juan H. Lily Peer, M.D.             DATE OF BIRTH:  1966/01/06   DATE OF ADMISSION:  11/13/2003  DATE OF DISCHARGE:  11/15/2003                                 DISCHARGE SUMMARY   DISCHARGE DIAGNOSES:  1. Symptomatic leiomyomata uterus.  2. Anemia.  3. Dysmenorrhea.  4. Menorrhagia.   PROCEDURE:  Myomectomy.   HISTORY OF PRESENT ILLNESS:  A 45 year old gravida 0 who had been seen in  the office for a second opinion for fibroid uterus.  Had been complaining of  a one year history of pelvic pain with long-term left lower quadrant pain,  dyspareunia and menorrhagia.  Ultrasound confirmed multiple myomas with  right and left ovaries normal.  She was placed on iron supplementation due  to iron-deficiency anemia.  She is currently on Ortho Tri-Cyclen.   HOSPITAL COURSE:  The patient was admitted for myomectomy on November 13, 2003.  She did well.  She remained afebrile.  She was discharged home in  satisfactory condition on the second postpartum day.  She was voiding,  tolerating fluids.   DISCHARGE LABORATORY DATA:  White count 7.3, hemoglobin 9.6, hematocrit  28.3, platelets 228.   DISPOSITION:  She was discharged home with instructions to follow up in five  days to remove staples.  Continue iron supplement daily.  Prescription for  Lortab 7.5 one every four to six hours if needed.  Instructions provided.     Davonna Belling. Young, N.P.                      Lars Mage H. Lily Peer, M.D.    [place] Lanius  D:  12/11/2003  T:  12/11/2003  Job:  [id number]

## 2013-01-09 ENCOUNTER — Emergency Department (HOSPITAL_COMMUNITY): Payer: Managed Care, Other (non HMO)

## 2013-01-09 ENCOUNTER — Emergency Department (HOSPITAL_COMMUNITY)
Admission: EM | Admit: 2013-01-09 | Discharge: 2013-01-09 | Disposition: A | Discharge status: Home / Self Care | Payer: Managed Care, Other (non HMO) | Attending: Emergency Medicine | Admitting: Emergency Medicine

## 2013-01-09 ENCOUNTER — Encounter (HOSPITAL_COMMUNITY): Payer: Self-pay

## 2013-01-09 DIAGNOSIS — N949 Unspecified condition associated with female genital organs and menstrual cycle: Secondary | ICD-10-CM | POA: Insufficient documentation

## 2013-01-09 DIAGNOSIS — R102 Pelvic and perineal pain: Secondary | ICD-10-CM

## 2013-01-09 DIAGNOSIS — R112 Nausea with vomiting, unspecified: Secondary | ICD-10-CM | POA: Insufficient documentation

## 2013-01-09 DIAGNOSIS — R141 Gas pain: Secondary | ICD-10-CM | POA: Insufficient documentation

## 2013-01-09 DIAGNOSIS — Z87891 Personal history of nicotine dependence: Secondary | ICD-10-CM | POA: Insufficient documentation

## 2013-01-09 DIAGNOSIS — Z3202 Encounter for pregnancy test, result negative: Secondary | ICD-10-CM | POA: Insufficient documentation

## 2013-01-09 DIAGNOSIS — Z79899 Other long term (current) drug therapy: Secondary | ICD-10-CM | POA: Insufficient documentation

## 2013-01-09 DIAGNOSIS — R1032 Left lower quadrant pain: Secondary | ICD-10-CM | POA: Insufficient documentation

## 2013-01-09 DIAGNOSIS — R142 Eructation: Secondary | ICD-10-CM | POA: Insufficient documentation

## 2013-01-09 DIAGNOSIS — Z8542 Personal history of malignant neoplasm of other parts of uterus: Secondary | ICD-10-CM | POA: Insufficient documentation

## 2013-01-09 DIAGNOSIS — Z8742 Personal history of other diseases of the female genital tract: Secondary | ICD-10-CM | POA: Insufficient documentation

## 2013-01-09 DIAGNOSIS — Z791 Long term (current) use of non-steroidal anti-inflammatories (NSAID): Secondary | ICD-10-CM | POA: Insufficient documentation

## 2013-01-09 HISTORY — DX: Unspecified ovarian cyst, right side: N83.201

## 2013-01-09 LAB — URINALYSIS, ROUTINE W REFLEX MICROSCOPIC
Bilirubin Urine: NEGATIVE
Glucose, UA: NEGATIVE mg/dL
Hgb urine dipstick: NEGATIVE
Ketones, ur: 40 mg/dL — AB
Leukocytes, UA: NEGATIVE
Nitrite: NEGATIVE
Protein, ur: NEGATIVE mg/dL
Specific Gravity, Urine: 1.022 (ref 1.005–1.030)
Urobilinogen, UA: 0.2 mg/dL (ref 0.0–1.0)
pH: 7 (ref 5.0–8.0)

## 2013-01-09 LAB — PREGNANCY, URINE: Preg Test, Ur: NEGATIVE

## 2013-01-09 LAB — WET PREP, GENITAL
Clue Cells Wet Prep HPF POC: NONE SEEN
Trich, Wet Prep: NONE SEEN
Yeast Wet Prep HPF POC: NONE SEEN

## 2013-01-09 MED ORDER — ONDANSETRON 4 MG PO TBDP
4.0000 mg | ORAL_TABLET | Freq: Once | ORAL | Status: AC
  Administered 2013-01-09: 4 mg via ORAL
  Filled 2013-01-09: qty 1

## 2013-01-09 MED ORDER — TRAMADOL HCL 50 MG PO TABS: 50.0000 mg | ORAL_TABLET | Freq: Four times a day (QID) | ORAL | Status: DC | PRN

## 2013-01-09 MED ORDER — OXYCODONE-ACETAMINOPHEN 5-325 MG PO TABS
2.0000 | ORAL_TABLET | Freq: Once | ORAL | Status: AC
  Administered 2013-01-09: 2 via ORAL
  Filled 2013-01-09: qty 2

## 2013-01-09 NOTE — ED Notes (Signed)
pelvic pain 10/10. No dysuria, vaginal discharge, odor. LMP was not  Normal. Has hx of uterine ablation.Has uterus thickening, possible polyp per pt. Patient has long term hx gyno. problems

## 2013-01-09 NOTE — ED Provider Notes (Signed)
History     CSN: [id number]  Arrival date & time 01/09/13  1478   First MD Initiated Contact with Patient 01/09/13 [phone]      Chief Complaint  Patient presents with  . Pelvic Pain    (Consider location/radiation/quality/duration/timing/severity/associated sxs/prior treatment) HPI 46 year old female with a past medical history of multiple gynecologic problems presents emergency  department with chief complaint of left lower quadrant abdominal pain.  Patient is seen by Dr. cousins.  She has a previous history of myomectomy and ablation.  Patient had this procedure done 2 years ago.  She recently began having menstrual bleeding in December of 2013.  His pain worked up by Dr. cousins for this issue.  She has regrown her endometrial light layer after ablation.  Patient states that this morning she developed left lower quadrant abdominal pain.  This morning it was rated at 10 out of 10.  She took Aleve with mild relief of symptoms.  She also states that she has had some bloating and feels that this may be gas.  She has no bleeding today.  Patient has recently increased her abdominal exercises.  She does vigorous workout yesterday. Denies any vaginal symptoms.  She denies any urinary symptoms.  No fevers, chills, nausea, vomiting Past Medical History  Diagnosis Date  . Ovarian cyst, right     Past Surgical History  Procedure Date  . Mym   . Myomectomy   . Ablation     No family history on file.  History  Substance Use Topics  . Smoking status: Former Games developer  . Smokeless tobacco: Not on file  . Alcohol Use: Yes     Comment: has at least glass of wine every day    OB History    Grav Para Term Preterm Abortions TAB SAB Ect Mult Living                  Review of Systems Ten systems reviewed and are negative for acute change, except as noted in the HPI.    Allergies  Review of patient's allergies indicates no known allergies.  Home Medications   Current Outpatient Rx  Name   Route  Sig  Dispense  Refill  . OMEGA-3 FATTY ACIDS 1000 MG PO CAPS   Oral   Take 1 g by mouth daily.         Marland Kitchen NAPROXEN 500 MG PO TBEC   Oral   Take 500 mg by mouth 2 (two) times daily with a meal. pain         . OVER THE COUNTER MEDICATION   Oral   Take 2 tablets by mouth every morning. doxiderol otc herbal supplement         . SIMETHICONE 80 MG PO TABS   Oral   Take 1 tablet by mouth daily as needed. gas           BP 118/73  Pulse 87  Temp 98.1 F (36.7 C) (Oral)  Resp 18  SpO2 100%  LMP 12/01/2012  Physical Exam  Physical Exam  Nursing note and vitals reviewed. Constitutional: She is oriented to person, place, and time. She appears well-developed and well-nourished. No distress.  HENT:  Head: Normocephalic and atraumatic.  Eyes: Conjunctivae normal and EOM are normal. Pupils are equal, round, and reactive to light. No scleral icterus.  Neck: Normal range of motion.  Cardiovascular: Normal rate, regular rhythm and normal heart sounds.  Exam reveals no gallop and no friction rub.  No murmur heard. Pulmonary/Chest: Effort normal and breath sounds normal. No respiratory distress.  Abdominal: Soft. Bowel sounds are normal. She exhibits no distension and no mass. There is no tenderness. There is no guarding.  Neurological: She is alert and oriented to person, place, and time.  Skin: Skin is warm and dry. She is not diaphoretic.   Pelvic exam: VULVA: normal appearing vulva with no masses, tenderness or lesions, VAGINA: normal appearing vagina with normal color and discharge, no lesions, CERVIX: ectropion , cervical stenosis present, UTERUS: uterus is normal size, shape, consistency and nontender, ADNEXA: no masses, surgically absent left.  ED Course  Procedures (including critical care time)  Labs Reviewed  WET PREP, GENITAL - Abnormal; Notable for the following:    WBC, Wet Prep HPF POC MODERATE (*)     All other components within normal limits  URINALYSIS,  ROUTINE W REFLEX MICROSCOPIC - Abnormal; Notable for the following:    Ketones, ur 40 (*)     All other components within normal limits  PREGNANCY, URINE  GC/CHLAMYDIA PROBE AMP   No results found.   No diagnosis found.    MDM  11:09 AM Patient with history of multiple oncologic issues.  She has no ovary or fallopian tube on left side.  Her pelvic exam is benign.  Patient appears fairly comfortable.  But is asking for pain medicine.  She last did show extremely examination.  Patient began vomiting after admi9n of percocet.  Nausea relieved with zofran  2:09 PM BP 118/73  Pulse 87  Temp 98.1 F (36.7 C) (Oral)  Resp 18  SpO2 100%  LMP 12/01/2012 Patient with negative abdominal CT scan.  Will d/c home with pain meds.  Follow up with OB/GYN.   Arthor Captain, PA-C 01/09/13 1414

## 2013-01-09 NOTE — ED Notes (Signed)
Patient transported to CT 

## 2013-01-09 NOTE — ED Notes (Signed)
Pt alert and oriented x4. Respirations even and unlabored. Bilateral rise and fall of chest. Skin warm and dry. In no acute distress. Pt asking for anti-gas medication.  Informed by Tuesday, RN that the MD was notified and nothing will be given, since she took something prior to arrival.

## 2013-01-09 NOTE — ED Notes (Addendum)
Patient vomiting. Treatment team aware

## 2013-01-09 NOTE — ED Notes (Signed)
Patient continuing to have emesis. Provider aware.

## 2013-01-09 NOTE — ED Notes (Signed)
Pt c/o increasing nausea and pain.

## 2013-01-09 NOTE — ED Notes (Signed)
Pt escorted to discharge window. Verbalized understanding discharge instructions. In no acute distress.   

## 2013-01-09 NOTE — ED Notes (Signed)
Pt complained severe pelvic pelvic pain that  Started @ 5am. Took Naproxen 550mg  at 730.

## 2013-01-09 NOTE — ED Provider Notes (Signed)
Medical screening examination/treatment/procedure(s) were conducted as a shared visit with non-physician practitioner(s) and myself.  I personally evaluated the patient during the encounter  Mild left lower quadrant tenderness without guarding.  CT scan demonstrates no acute pathology.  Pelvic was without pathology.  Discharge home with close followup with her OB/GYN.  She no longer has her left ovary the  Ct Abdomen Pelvis Wo Contrast  01/09/2013  *RADIOLOGY REPORT*  Clinical Data: Left lower abdominal pain.  Previous   uterine ablation.  CT ABDOMEN AND PELVIS WITHOUT CONTRAST  Technique:  Multidetector CT imaging of the abdomen and pelvis was performed following the standard protocol without intravenous contrast.  Comparison: None.  Findings: Visualized lung bases clear.  Unremarkable liver, gallbladder, spleen, adrenal glands, right kidney.  2.6 cm exophytic fluid attenuation lesion from the interpolar region left kidney probably cyst, incompletely characterized.  No hydronephrosis or nephrolithiasis.  Unremarkable pancreas.  Aorta normal in caliber.  Stomach, small bowel, and colon are nondilated. Normal appendix.  Uterus is somewhat globular and enlarged.  There is a small amount of fluid in the cul-de-sac.  Adnexal regions grossly unremarkable.  Urinary bladder is nondistended.  IMPRESSION:  1.  No acute abdominal process. 2. Myomatous uterus. 3.  Small amount of free pelvic fluid, which can be physiologic in menstruating females.   Original Report Authenticated By: D. Andria Rhein, MD   I personally reviewed the imaging tests through PACS system I reviewed available ER/hospitalization records through the EMR   Lyanne Co, MD 01/09/13 1531

## 2013-01-10 LAB — GC/CHLAMYDIA PROBE AMP
CT Probe RNA: NEGATIVE
GC Probe RNA: NEGATIVE

## 2013-01-18 ENCOUNTER — Encounter (HOSPITAL_COMMUNITY): Payer: Self-pay

## 2013-01-21 ENCOUNTER — Encounter (HOSPITAL_COMMUNITY): Payer: Self-pay | Admitting: *Deleted

## 2013-01-24 ENCOUNTER — Other Ambulatory Visit: Payer: Self-pay | Admitting: Obstetrics and Gynecology

## 2013-01-28 ENCOUNTER — Encounter (HOSPITAL_COMMUNITY)
Admission: RE | Disposition: A | Discharge status: Home / Self Care | Payer: Self-pay | Source: Ambulatory Visit | Attending: Obstetrics and Gynecology

## 2013-01-28 ENCOUNTER — Encounter (HOSPITAL_COMMUNITY): Payer: Self-pay | Admitting: Anesthesiology

## 2013-01-28 ENCOUNTER — Ambulatory Visit (HOSPITAL_COMMUNITY)
Admission: RE | Admit: 2013-01-28 | Discharge: 2013-01-28 | Disposition: A | Discharge status: Home / Self Care | Payer: Managed Care, Other (non HMO) | Source: Ambulatory Visit | Attending: Obstetrics and Gynecology | Admitting: Obstetrics and Gynecology

## 2013-01-28 ENCOUNTER — Ambulatory Visit (HOSPITAL_COMMUNITY): Payer: Managed Care, Other (non HMO) | Admitting: Anesthesiology

## 2013-01-28 DIAGNOSIS — D250 Submucous leiomyoma of uterus: Secondary | ICD-10-CM | POA: Insufficient documentation

## 2013-01-28 DIAGNOSIS — N938 Other specified abnormal uterine and vaginal bleeding: Secondary | ICD-10-CM | POA: Insufficient documentation

## 2013-01-28 DIAGNOSIS — N949 Unspecified condition associated with female genital organs and menstrual cycle: Secondary | ICD-10-CM | POA: Insufficient documentation

## 2013-01-28 DIAGNOSIS — N857 Hematometra: Secondary | ICD-10-CM | POA: Insufficient documentation

## 2013-01-28 HISTORY — DX: Cardiac arrhythmia, unspecified: I49.9

## 2013-01-28 HISTORY — PX: DILATATION & CURRETTAGE/HYSTEROSCOPY WITH RESECTOCOPE: SHX5572

## 2013-01-28 HISTORY — DX: Nausea with vomiting, unspecified: R11.2

## 2013-01-28 HISTORY — DX: Gastro-esophageal reflux disease without esophagitis: K21.9

## 2013-01-28 HISTORY — DX: Other specified postprocedural states: R11.2

## 2013-01-28 HISTORY — DX: Other specified postprocedural states: Z98.890

## 2013-01-28 HISTORY — DX: Headache: R51

## 2013-01-28 HISTORY — DX: Anxiety disorder, unspecified: F41.9

## 2013-01-28 LAB — CBC
HCT: 38.7 % (ref 36.0–46.0)
Hemoglobin: 12.7 g/dL (ref 12.0–15.0)
MCH: 29.8 pg (ref 26.0–34.0)
MCV: 90.8 fL (ref 78.0–100.0)
Platelets: 239 10*3/uL (ref 150–400)
RBC: 4.26 MIL/uL (ref 3.87–5.11)
WBC: 4.9 10*3/uL (ref 4.0–10.5)

## 2013-01-28 SURGERY — DILATATION & CURETTAGE/HYSTEROSCOPY WITH RESECTOCOPE
Anesthesia: General | Wound class: Clean Contaminated

## 2013-01-28 MED ORDER — NAPROXEN SODIUM 550 MG PO TABS: 550.0000 mg | ORAL_TABLET | Freq: Every day | ORAL | Status: DC | PRN

## 2013-01-28 MED ORDER — FENTANYL CITRATE 0.05 MG/ML IJ SOLN
INTRAMUSCULAR | Status: AC
  Administered 2013-01-28: 25 ug via INTRAVENOUS
  Filled 2013-01-28: qty 2

## 2013-01-28 MED ORDER — HYDROMORPHONE HCL 2 MG PO TABS: 2.0000 mg | ORAL_TABLET | ORAL | Status: DC | PRN

## 2013-01-28 MED ORDER — ONDANSETRON HCL 4 MG/2ML IJ SOLN
INTRAMUSCULAR | Status: DC | PRN
  Administered 2013-01-28: 4 mg via INTRAVENOUS

## 2013-01-28 MED ORDER — KETOROLAC TROMETHAMINE 30 MG/ML IJ SOLN
INTRAMUSCULAR | Status: DC | PRN
  Administered 2013-01-28: 30 mg via INTRAVENOUS

## 2013-01-28 MED ORDER — KETOROLAC TROMETHAMINE 30 MG/ML IJ SOLN: 15.0000 mg | Freq: Once | INTRAMUSCULAR | Status: DC | PRN

## 2013-01-28 MED ORDER — SCOPOLAMINE 1 MG/3DAYS TD PT72
MEDICATED_PATCH | TRANSDERMAL | Status: AC
  Administered 2013-01-28: 1.5 mg via TRANSDERMAL
  Filled 2013-01-28: qty 1

## 2013-01-28 MED ORDER — MIDAZOLAM HCL 2 MG/2ML IJ SOLN
INTRAMUSCULAR | Status: AC
  Filled 2013-01-28: qty 2

## 2013-01-28 MED ORDER — LIDOCAINE HCL (CARDIAC) 20 MG/ML IV SOLN
INTRAVENOUS | Status: AC
  Filled 2013-01-28: qty 5

## 2013-01-28 MED ORDER — GLYCOPYRROLATE 0.2 MG/ML IJ SOLN
INTRAMUSCULAR | Status: AC
  Filled 2013-01-28: qty 1

## 2013-01-28 MED ORDER — MIDAZOLAM HCL 5 MG/5ML IJ SOLN
INTRAMUSCULAR | Status: DC | PRN
  Administered 2013-01-28: 2 mg via INTRAVENOUS

## 2013-01-28 MED ORDER — DEXAMETHASONE SODIUM PHOSPHATE 4 MG/ML IJ SOLN
INTRAMUSCULAR | Status: DC | PRN
  Administered 2013-01-28: 10 mg via INTRAVENOUS

## 2013-01-28 MED ORDER — FENTANYL CITRATE 0.05 MG/ML IJ SOLN: 25.0000 ug | INTRAMUSCULAR | Status: DC | PRN

## 2013-01-28 MED ORDER — KETOROLAC TROMETHAMINE 60 MG/2ML IM SOLN
INTRAMUSCULAR | Status: DC | PRN
  Administered 2013-01-28: 30 mg via INTRAMUSCULAR

## 2013-01-28 MED ORDER — LIDOCAINE HCL (CARDIAC) 20 MG/ML IV SOLN
INTRAVENOUS | Status: DC | PRN
  Administered 2013-01-28: 60 mg via INTRAVENOUS

## 2013-01-28 MED ORDER — PROPOFOL 10 MG/ML IV EMUL
INTRAVENOUS | Status: AC
  Filled 2013-01-28: qty 20

## 2013-01-28 MED ORDER — DEXAMETHASONE SODIUM PHOSPHATE 10 MG/ML IJ SOLN
INTRAMUSCULAR | Status: AC
  Filled 2013-01-28: qty 1

## 2013-01-28 MED ORDER — PROPOFOL 10 MG/ML IV EMUL
INTRAVENOUS | Status: DC | PRN
  Administered 2013-01-28: 200 mg via INTRAVENOUS

## 2013-01-28 MED ORDER — SODIUM CHLORIDE 0.9 % IR SOLN
Status: DC | PRN
  Administered 2013-01-28: 3000 mL

## 2013-01-28 MED ORDER — CHLOROPROCAINE HCL 1 % IJ SOLN
INTRAMUSCULAR | Status: AC
  Filled 2013-01-28: qty 30

## 2013-01-28 MED ORDER — ONDANSETRON HCL 4 MG/2ML IJ SOLN
INTRAMUSCULAR | Status: AC
  Filled 2013-01-28: qty 2

## 2013-01-28 MED ORDER — LACTATED RINGERS IV SOLN
INTRAVENOUS | Status: DC
  Administered 2013-01-28 (×2): via INTRAVENOUS

## 2013-01-28 MED ORDER — SCOPOLAMINE 1 MG/3DAYS TD PT72: 1.0000 | MEDICATED_PATCH | TRANSDERMAL | Status: DC

## 2013-01-28 MED ORDER — FENTANYL CITRATE 0.05 MG/ML IJ SOLN
INTRAMUSCULAR | Status: DC | PRN
  Administered 2013-01-28 (×4): 50 ug via INTRAVENOUS

## 2013-01-28 MED ORDER — GLYCINE 1.5 % IR SOLN
Status: DC | PRN
  Administered 2013-01-28: 3000 mL

## 2013-01-28 MED ORDER — FENTANYL CITRATE 0.05 MG/ML IJ SOLN
INTRAMUSCULAR | Status: AC
  Filled 2013-01-28: qty 4

## 2013-01-28 MED ORDER — CHLOROPROCAINE HCL 1 % IJ SOLN
INTRAMUSCULAR | Status: DC | PRN
  Administered 2013-01-28: 20 mL

## 2013-01-28 SURGICAL SUPPLY — 21 items
CANISTER SUCTION 2500CC (MISCELLANEOUS) ×2 IMPLANT
CATH ROBINSON RED A/P 16FR (CATHETERS) ×2 IMPLANT
CLOTH BEACON ORANGE TIMEOUT ST (SAFETY) ×2 IMPLANT
CONTAINER PREFILL 10% NBF 60ML (FORM) ×4 IMPLANT
DILATOR CANAL MILEX (MISCELLANEOUS) ×2 IMPLANT
DRESSING TELFA 8X3 (GAUZE/BANDAGES/DRESSINGS) ×2 IMPLANT
ELECT REM PT RETURN 9FT ADLT (ELECTROSURGICAL) ×2
ELECTRODE REM PT RTRN 9FT ADLT (ELECTROSURGICAL) ×1 IMPLANT
ELECTRODE ROLLER VERSAPOINT (ELECTRODE) IMPLANT
ELECTRODE RT ANGLE VERSAPOINT (CUTTING LOOP) ×2 IMPLANT
GAUZE SPONGE 4X4 16PLY XRAY LF (GAUZE/BANDAGES/DRESSINGS) ×2 IMPLANT
GLOVE BIO SURGEON STRL SZ 6.5 (GLOVE) ×2 IMPLANT
GLOVE BIOGEL PI IND STRL 7.0 (GLOVE) ×1 IMPLANT
GLOVE BIOGEL PI INDICATOR 7.0 (GLOVE) ×1
GOWN STRL REIN XL XLG (GOWN DISPOSABLE) ×6 IMPLANT
LEGGING LITHOTOMY PAIR STRL (DRAPES) ×2 IMPLANT
LOOP ANGLED CUTTING 22FR (CUTTING LOOP) ×2 IMPLANT
PACK HYSTEROSCOPY LF (CUSTOM PROCEDURE TRAY) ×2 IMPLANT
PAD OB MATERNITY 4.3X12.25 (PERSONAL CARE ITEMS) ×2 IMPLANT
TOWEL OR 17X24 6PK STRL BLUE (TOWEL DISPOSABLE) ×4 IMPLANT
WATER STERILE IRR 1000ML POUR (IV SOLUTION) ×2 IMPLANT

## 2013-01-28 NOTE — Anesthesia Procedure Notes (Signed)
Procedure Name: LMA Insertion Date/Time: 01/28/2013 4:26 PM Performed by: Graciela Husbands Pre-anesthesia Checklist: Patient identified, Emergency Drugs available, Patient being monitored, Suction available and Timeout performed Patient Re-evaluated:Patient Re-evaluated prior to inductionOxygen Delivery Method: Circle system utilized Preoxygenation: Pre-oxygenation with 100% oxygen Intubation Type: IV induction Ventilation: Mask ventilation without difficulty LMA: LMA inserted LMA Size: 4.0 Number of attempts: 1 Placement Confirmation: breath sounds checked- equal and bilateral and positive ETCO2 Tube secured with: Tape Dental Injury: Teeth and Oropharynx as per pre-operative assessment

## 2013-01-28 NOTE — Anesthesia Preprocedure Evaluation (Addendum)
Anesthesia Evaluation  Patient identified by MRN, date of birth, ID band Patient awake    Reviewed: Allergy & Precautions, H&P , NPO status , Patient's Chart, lab work & pertinent test results, reviewed documented beta blocker date and time   History of Anesthesia Complications (+) PONV  Airway Mallampati: III TM Distance: >3 FB Neck ROM: full    Dental  (+) Teeth Intact   Pulmonary Recent URI  (dry cough since a cold 6 weeks ago, no fever), Residual Cough, former smoker (quit 1994),  breath sounds clear to auscultation  Pulmonary exam normal       Cardiovascular Exercise Tolerance: Good negative cardio ROS  Rhythm:regular Rate:Normal     Neuro/Psych negative neurological ROS  negative psych ROS   GI/Hepatic Neg liver ROS, GERD- (prn otc meds)  ,  Endo/Other  negative endocrine ROS  Renal/GU negative Renal ROS  Female GU complaint     Musculoskeletal   Abdominal   Peds  Hematology negative hematology ROS (+)   Anesthesia Other Findings   Reproductive/Obstetrics negative OB ROS                          Anesthesia Physical Anesthesia Plan  ASA: II  Anesthesia Plan: General LMA   Post-op Pain Management:    Induction:   Airway Management Planned:   Additional Equipment:   Intra-op Plan:   Post-operative Plan:   Informed Consent: I have reviewed the patients History and Physical, chart, labs and discussed the procedure including the risks, benefits and alternatives for the proposed anesthesia with the patient or authorized representative who has indicated his/her understanding and acceptance.   Dental Advisory Given  Plan Discussed with: CRNA and Surgeon  Anesthesia Plan Comments:         Anesthesia Quick Evaluation

## 2013-01-28 NOTE — Anesthesia Postprocedure Evaluation (Signed)
  Anesthesia Post-op Note  Anesthesia Post Note  Patient: Kristina [name]  Procedure(s) Performed: Procedure(s) (LRB): DILATATION & CURETTAGE/HYSTEROSCOPY WITH RESECTOCOPE resection of submucosal fibroid (N/A)  Anesthesia type: General  Patient location: PACU  Post pain: Pain level controlled  Post assessment: Post-op Vital signs reviewed  Last Vitals:  Filed Vitals:   01/28/13 1830  BP: 116/80  Pulse: 71  Temp: 36.5 C  Resp: 29    Post vital signs: Reviewed  Level of consciousness: sedated  Complications: No apparent anesthesia complications

## 2013-01-28 NOTE — Brief Op Note (Signed)
01/28/2013  6:00 PM  PATIENT:  Kristina [name]  47 y.o. female  PRE-OPERATIVE DIAGNOSIS:  endometrial mass  , menorrhagia POST-OPERATIVE DIAGNOSIS: submucosal fibroids, menorrhagia, hematometra  PROCEDURE: Diagnostic hysteroscopy, hysteroscopic resection of submucosal fibroids, dilation and currettage  SURGEON:  Surgeon(s) and Role:    * Evelyn Moch Cathie Beams, MD - Primary  PHYSICIAN ASSISTANT:   ASSISTANTS: none   ANESTHESIA:   general and paracervical block FINDINGS:  Distorted uterine cavity. Multiple submucosal fibroids(3), right tubal ostia w/ brown fluid extruding. Endometrial cavity w/ old brown blood ~ 40 cc  EBL:  Total I/O In: 1100 [I.V.:1100] Out: 10 [Blood:10]  BLOOD ADMINISTERED:none  DRAINS: none   LOCAL MEDICATIONS USED:  OTHER 1% nesicaine  SPECIMEN:  Source of Specimen:  emc w/ fibroid resection  DISPOSITION OF SPECIMEN:  PATHOLOGY  COUNTS:  YES  TOURNIQUET:  * No tourniquets in log *  DICTATION: .Other Dictation: Dictation Number [id number]  PLAN OF CARE: Discharge to home after PACU  PATIENT DISPOSITION:  PACU - hemodynamically stable.   Delay start of Pharmacological VTE agent (>24hrs) due to surgical blood loss or risk of bleeding: no

## 2013-01-28 NOTE — Transfer of Care (Signed)
Immediate Anesthesia Transfer of Care Note  Patient: Kristina [name]  Procedure(s) Performed: Procedure(s) with comments: DILATATION & CURETTAGE/HYSTEROSCOPY WITH RESECTOCOPE resection of submucosal fibroid (N/A) -  diagnostic hysteroscopy with resection of submucosal fibroid  Patient Location: PACU  Anesthesia Type:General  Level of Consciousness: awake, alert  and oriented  Airway & Oxygen Therapy: Patient Spontanous Breathing and Patient connected to nasal cannula oxygen  Post-op Assessment: Report given to PACU RN and Post -op Vital signs reviewed and stable  Post vital signs: Reviewed and stable  Complications: No apparent anesthesia complications

## 2013-01-29 NOTE — Op Note (Signed)
Kristina [name], Kristina [name]              ACCOUNT NO.:  [id number]  MEDICAL RECORD NO.:  [id number]  LOCATION:  [facility]                          FACILITY:  [facility]  PHYSICIAN:  Maxie Better, M.D.DATE OF BIRTH:  06/12/1966  DATE OF PROCEDURE:  01/28/2013 DATE OF DISCHARGE:  01/28/2013                              OPERATIVE REPORT   PREOPERATIVE DIAGNOSIS:  Menorrhagia, endometrial mass.  POSTOPERATIVE DIAGNOSIS:  Menorrhagia, submucosal fibroids, and hematometra.  PROCEDURE:  A diagnostic hysteroscopy, hysteroscopic resection of submucosal fibroid, dilation and curettage.  ANESTHESIA:  General, paracervical block.  SURGEON:  Maxie Better, M.D.  ASSISTANT:  None.  PROCEDURE IN DETAIL:  Under adequate general anesthesia, the patient was placed in the dorsal lithotomy position.  She was sterilely prepped and draped in usual fashion.  The bladder catheterized for moderate amount of urine.  Examination under anesthesia revealed anteverted uterus, slightly enlarged.  No adnexal masses could be appreciated.  A bivalve speculum was placed in the vagina.  Single-tooth tenaculum was placed on the anterior lip of the cervix.  A 20 mL of 1% Nesacaine was injected paracervically at the 3 and 9 o'clock position.  The cervix was dilatable with a small dilator.  Therefore, an os Finder was utilized. When the os Finder was utilized and dilated the cervix, copious amount of dark brown old blood extravasated from the cavity, approximately 40- 50 mL of blood was noted.  A small diagnostic hysteroscope was then inserted and was notable for the cervical canal to be visible.  There was a small opening that would traverse the internal os, but was not able to be traversed with the diagnostic hysteroscope.  The hysteroscope was then removed.  The os Finder was then again utilized to further try to open up that the internal os.  Continued amount of brown fluid was removed until it actually slowed down at  that point.  The diagnostic hysteroscope was then reinserted.  The cavity was entered successfully and small amount of brown fluid noted coming from the right tubal ostia. The left ostia had been surgically removed.  There was no ostia noted.  The cavity was irregular shape with submucosal fibroid noted in the right upper quadrant.  At that point, there was some more extravasation of same brown material of fluid from the right tubal ostia. The patient had endometrial ablation in 2012.  The hysteroscope was removed.  The resectoscope was set up for that particular apparatus and the cervical os was able to accept up to #27 [facility] dilator.  The resectoscope was then inserted with a single loop, the submucosal fibroid  was resected.  There was another larger fibroid in the right lower quadrant, and a smaller one anteriorly all of which were resected.  Once resected pieces were removed.  No other lesions was noted.  The resectoscope was removed. The cavity was then gently curetted. Specimen labelled  endometrial curetting with fibroid resection was sent to Pathology.  The hysteroscope was then reinserted.  No other lesions was noted.  The resected areas had hemostasis.  At that point, the procedure was felt to be completed and all instruments were then removed from the vagina.  SPECIMEN:  Endometrial curetting  with fibroid resections sent to Pathology.  ESTIMATED BLOOD LOSS: minimal  COMPLICATION:  None.  The patient tolerated the procedure well, was transferred to recovery room in stable condition.     Maxie Better, M.D.     [place]/MEDQ  D:  01/28/2013  T:  01/29/2013  Job:  [id number]

## 2013-01-31 ENCOUNTER — Encounter (HOSPITAL_COMMUNITY): Payer: Self-pay | Admitting: Obstetrics and Gynecology

## 2013-04-15 ENCOUNTER — Ambulatory Visit (INDEPENDENT_AMBULATORY_CARE_PROVIDER_SITE_OTHER)
Admission: RE | Admit: 2013-04-15 | Discharge: 2013-04-15 | Disposition: A | Discharge status: Home / Self Care | Payer: Managed Care, Other (non HMO) | Source: Ambulatory Visit | Attending: Internal Medicine | Admitting: Internal Medicine

## 2013-04-15 ENCOUNTER — Ambulatory Visit (INDEPENDENT_AMBULATORY_CARE_PROVIDER_SITE_OTHER): Payer: Managed Care, Other (non HMO) | Admitting: Internal Medicine

## 2013-04-15 ENCOUNTER — Encounter: Payer: Self-pay | Admitting: Internal Medicine

## 2013-04-15 VITALS — BP 106/60 | HR 82 | Temp 97.0°F | Ht 63.0 in | Wt 134.8 lb

## 2013-04-15 DIAGNOSIS — R05 Cough: Secondary | ICD-10-CM | POA: Insufficient documentation

## 2013-04-15 NOTE — Patient Instructions (Addendum)
Try prilosec 20mg   Take 30-60 min before first meal of the day and Pepcid 20 mg one bedtime until cough is completely gone for at least a week without the need for cough suppression.  I think of reflux for chronic cough like I do oxygen for fire (doesn't cause the fire but once you get the oxygen suppressed it usually goes away regardless of the exact cause).    GERD (REFLUX)  is an extremely common cause of respiratory symptoms, many times with no significant heartburn at all.    It can be treated with medication, but also with lifestyle changes including avoidance of late meals, excessive alcohol, smoking cessation, and avoid fatty foods, chocolate, peppermint, colas, red wine, and acidic juices such as orange juice.  NO MINT OR MENTHOL PRODUCTS SO NO COUGH DROPS  USE SUGARLESS CANDY INSTEAD (jolley ranchers or Stover's)  NO OIL BASED VITAMINS - use powdered substitutes.    Please remember to go to the  x-ray department downstairs for your tests - we will call you with the results when they are available.

## 2013-04-15 NOTE — Assessment & Plan Note (Signed)
The most common causes of chronic cough in immunocompetent adults include the following: upper airway cough syndrome (UACS), previously referred to as postnasal drip syndrome (PNDS), which is caused by variety of rhinosinus conditions; (2) asthma; (3) GERD; (4) chronic bronchitis from cigarette smoking or other inhaled environmental irritants; (5) nonasthmatic eosinophilic bronchitis; and (6) bronchiectasis.   These conditions, singly or in combination, have accounted for up to 94% of the causes of chronic cough in prospective studies.   Other conditions have constituted no >6% of the causes in prospective studies These have included bronchogenic carcinoma, chronic interstitial pneumonia, sarcoidosis, left ventricular failure, ACEI-induced cough, and aspiration from a condition associated with pharyngeal dysfunction.    Chronic cough is often simultaneously caused by more than one condition. A single cause has been found from 38 to 82% of the time, multiple causes from 18 to 62%. Multiply caused cough has been the result of three diseases up to 42% of the time.       This is most likely  Classic Upper airway cough syndrome, so named because it's frequently impossible to sort out how much is  CR/sinusitis with freq throat clearing (which can be related to primary GERD)   vs  causing  secondary (" extra esophageal")  GERD from wide swings in gastric pressure that occur with throat clearing, often  promoting self use of mint and menthol lozenges that reduce the lower esophageal sphincter tone and exacerbate the problem further in a cyclical fashion.   These are the same pts (now being labeled as having "irritable larynx syndrome" by some cough centers) who not infrequently have a history of having failed to tolerate ace inhibitors,  dry powder inhalers or biphosphonates or report having atypical reflux symptoms that don't respond to standard doses of PPI , and are easily confused as having aecopd or asthma  flares by even experienced allergists/ pulmonologists.   Key is early aggressive rx with gerd diet/ acid suppression then ov to eval if still present after 2 weeks

## 2013-04-15 NOTE — Progress Notes (Signed)
  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   04/15/2013 1st pulmonary ov cc really bad cough p colds no pattern can last for months never require ov's or abx or prednisone assoc with sense of pnds but more of a dry day time cough and no sob - actually better x 2 weeks spontaneously 2 weeks prior to OV  But wanted an opinion on how to get better quicker p next flare which typically will occur no more than once a year.  Onset is always abrupt with viral like uri as was the case on this occasion.  No obvious daytime variabilty or assoc chronic cough or cp or chest tightness, subjective wheeze overt sinus or hb symptoms. No unusual exp hx or h/o childhood pna/ asthma or premature birth to herknowledge.    Review of Systems  Constitutional: Negative for fever, chills and unexpected weight change.  HENT: Positive for ear pain and postnasal drip. Negative for nosebleeds, congestion, sore throat, rhinorrhea, sneezing, trouble swallowing, dental problem, voice change and sinus pressure.   Eyes: Negative for visual disturbance.  Respiratory: Negative for cough, choking and shortness of breath.   Cardiovascular: Negative for chest pain and leg swelling.  Gastrointestinal: Negative for vomiting, abdominal pain and diarrhea.  Genitourinary: Negative for difficulty urinating.  Musculoskeletal: Negative for arthralgias.  Skin: Negative for rash.  Neurological: Negative for tremors, syncope and headaches.  Hematological: Does not bruise/bleed easily.       Objective:   Physical Exam  amb bf nad Wt Readings from Last 3 Encounters:  04/15/13 134 lb 12.8 oz (61.145 kg)  01/21/13 138 lb 6 oz (62.766 kg)  01/21/13 138 lb 6 oz (62.766 kg)   HEENT: nl dentition, turbinates, and orophanx. Nl external ear canals without cough reflex   NECK :  without JVD/Nodes/TM/ nl carotid upstrokes bilaterally   LUNGS: no acc muscle use, clear to A and P bilaterally without cough on insp or exp maneuvers   CV:  RRR  no s3 or murmur or increase in P2, no edema   ABD:  soft and nontender with nl excursion in the supine position. No bruits or organomegaly, bowel sounds nl  MS:  warm without deformities, calf tenderness, cyanosis or clubbing  SKIN: warm and dry without lesions    NEURO:  alert, approp, no deficits     CXR  04/15/2013 :  No acute cardiopulmonary abnormality seen.        Assessment & Plan:

## 2013-11-28 ENCOUNTER — Ambulatory Visit (INDEPENDENT_AMBULATORY_CARE_PROVIDER_SITE_OTHER): Payer: Managed Care, Other (non HMO) | Admitting: Interventional Cardiology

## 2013-11-28 ENCOUNTER — Encounter: Payer: Self-pay | Admitting: Cardiology

## 2013-11-28 ENCOUNTER — Encounter: Payer: Self-pay | Admitting: Interventional Cardiology

## 2013-11-28 ENCOUNTER — Ambulatory Visit (HOSPITAL_COMMUNITY): Payer: Managed Care, Other (non HMO) | Attending: Cardiology | Admitting: Radiology

## 2013-11-28 VITALS — BP 110/68 | HR 82 | Ht 63.0 in | Wt 138.0 lb

## 2013-11-28 DIAGNOSIS — Q248 Other specified congenital malformations of heart: Secondary | ICD-10-CM | POA: Insufficient documentation

## 2013-11-28 DIAGNOSIS — R002 Palpitations: Secondary | ICD-10-CM | POA: Insufficient documentation

## 2013-11-28 DIAGNOSIS — Z87891 Personal history of nicotine dependence: Secondary | ICD-10-CM | POA: Insufficient documentation

## 2013-11-28 DIAGNOSIS — I493 Ventricular premature depolarization: Secondary | ICD-10-CM

## 2013-11-28 DIAGNOSIS — I4949 Other premature depolarization: Secondary | ICD-10-CM

## 2013-11-28 NOTE — Patient Instructions (Signed)
Your physician recommends that you continue on your current medications as directed. Please refer to the Current Medication list given to you today.  Labs today: Magnesium, TSH, Potassium  Your physician has requested that you have an echocardiogram. Echocardiography is a painless test that uses sound waves to create images of your heart. It provides your doctor with information about the size and shape of your heart and how well your heart's chambers and valves are working. This procedure takes approximately one hour. There are no restrictions for this procedure.

## 2013-11-28 NOTE — Progress Notes (Signed)
Patient ID: Kristina [name], female   DOB: [birth year], 47 y.o.   MRN: [id number]   Date: 11/28/2013 ID: Kristina [name], DOB 10-16-1966, MRN [id number] PCP: Serita Kyle, MD  Reason: Palpitations  ASSESSMENT;  1. Premature ventricular contractions, previously diagnosed   PLAN:  1. 2-D Doppler echocardiogram 2. Potassium, magnesium, and TSH   SUBJECTIVE: Kristina [name] is a 47 y.o. female who is being evaluated for palpitations. These palpitations have been previously diagnosed as PVCs greater than 10 years ago. There is no history of underlying heart disease. The palpitations occur relatively infrequently but when they begin, they can persist for several days. There is no tachycardia. She denies lightheadedness, dizziness, syncope, chest pain, dyspnea, and other complaints. She does not exercise. States that her diet is not ideal. She has no exertional limitations. These complaints or more prevalent when she is at rest than with physical activity.  Allergies  Allergen Reactions  . Metronidazole     headache  . Percocet [Oxycodone-Acetaminophen] Nausea And Vomiting    "out of her mind"  . Tetracyclines & Related Nausea Only    Current Outpatient Prescriptions on File Prior to Visit  Medication Sig Dispense Refill  . Aspirin-Acetaminophen-Caffeine (GOODY HEADACHE PO) Take 1 Package by mouth daily as needed (headache).      . mometasone (NASONEX) 50 MCG/ACT nasal spray Place 2 sprays into the nose daily as needed.      . naproxen sodium (ANAPROX) 550 MG tablet Take 1 tablet (550 mg total) by mouth daily as needed (pelvic pain).  60 tablet  4  . NON FORMULARY Brain focus tablet daily      . omeprazole (PRILOSEC) 20 MG capsule Take 20 mg by mouth daily as needed.        No current facility-administered medications on file prior to visit.    Past Medical History  Diagnosis Date  . Ovarian cyst, right   . PONV (postoperative nausea and vomiting)   . GERD  (gastroesophageal reflux disease)   . Headache(784.0)   . Anxiety   . Dysrhythmia     pt states she wore a holter in distant past for pvc-told d/t caffeine intake    Past Surgical History  Procedure Laterality Date  . Mym    . Myomectomy    . Ablation    . Diagnostic laparoscopy    . Dilatation & currettage/hysteroscopy with resectocope N/A 01/28/2013    Procedure: DILATATION & CURETTAGE/HYSTEROSCOPY WITH RESECTOCOPE resection of submucosal fibroid;  Surgeon: Serita Kyle, MD;  Location: [facility];  Service: Gynecology;  Laterality: N/A;   diagnostic hysteroscopy with resection of submucosal fibroid    History   Social History  . Marital Status: Married    Spouse Name: N/A    Number of Children: N/A  . Years of Education: N/A   Occupational History  . Not on file.   Social History Main Topics  . Smoking status: Former Smoker -- 0.25 packs/day for 10 years    Types: Cigarettes    Quit date: 01/21/1993  . Smokeless tobacco: Never Used  . Alcohol Use: Yes     Comment: has at least glass of wine every day  . Drug Use: No  . Sexual Activity: Yes   Other Topics Concern  . Not on file   Social History Narrative  . No narrative on file    No family history on file.  ROS: Denies syncope, edema, claudication,. She does complain of dyspepsia. Other systems negative for  complaints.  OBJECTIVE: BP 110/68  Pulse 82  Ht 5\' 3"  (1.6 m)  Wt 138 lb (62.596 kg)  BMI 24.45 kg/m2,  General: No acute distress, normal HEENT: normal  Neck: JVD flat. Carotids 2+ Chest: Clear Cardiac: Murmur: Absent. Gallop: Absent. Rhythm: Normal. Other: Normal Abdomen: Bruit: Absent. Pulsation: Absent Extremities: Edema: Absent. Pulses: 2+ Neuro: Normal Psych: Normal  ECG: Normal sinus rhythm QS pattern V1 and V2 likely related to lead position.

## 2013-11-28 NOTE — Progress Notes (Signed)
Echocardiogram performed.  

## 2013-11-29 ENCOUNTER — Telehealth: Payer: Self-pay

## 2013-11-29 NOTE — Telephone Encounter (Signed)
called to give pt lab results lmom for pt to return call 

## 2013-11-29 NOTE — Telephone Encounter (Signed)
Message copied by Jarvis Newcomer on Tue Nov 29, 2013  1:58 PM ------      Message from: Verdis Prime      Created: Mon Nov 28, 2013  6:07 PM       Potassium is low normal at 3.8. Everything else is okay. Supplement potassium in diet with bananas, orange juice, raisins, etc. ------

## 2013-11-30 NOTE — Telephone Encounter (Signed)
2nd attempt lmom. The echocardiogram is normal. The palpitations therefore are not life threatening. Cut back on caffeine and alcohol. The me know if symptoms continue or worsen. No limitations. Goode and  BC headache powder have caffeine in them

## 2013-11-30 NOTE — Telephone Encounter (Signed)
Message copied by Jarvis Newcomer on Wed Nov 30, 2013  9:14 AM ------      Message from: Verdis Prime      Created: Mon Nov 28, 2013  6:06 PM       The echocardiogram is normal. The palpitations therefore are not life threatening. Cut back on caffeine and alcohol. The me know if symptoms continue or worsen. No limitations. Good he is in [facility] headache powder have caffeine in them ------

## 2013-12-07 ENCOUNTER — Telehealth: Payer: Self-pay

## 2013-12-07 NOTE — Telephone Encounter (Signed)
Message copied by Jarvis Newcomer on Wed Dec 07, 2013  4:31 PM ------      Message from: Verdis Prime      Created: Mon Nov 28, 2013  6:07 PM       Potassium is low normal at 3.8. Everything else is okay. Supplement potassium in diet with bananas, orange juice, raisins, etc. ------

## 2014-01-30 ENCOUNTER — Inpatient Hospital Stay (HOSPITAL_COMMUNITY)
Admission: AD | Admit: 2014-01-30 | Discharge: 2014-01-30 | Disposition: A | Discharge status: Home / Self Care | Payer: Managed Care, Other (non HMO) | Source: Ambulatory Visit | Attending: Obstetrics and Gynecology | Admitting: Obstetrics and Gynecology

## 2014-01-30 ENCOUNTER — Encounter (HOSPITAL_COMMUNITY): Payer: Self-pay | Admitting: *Deleted

## 2014-01-30 DIAGNOSIS — N949 Unspecified condition associated with female genital organs and menstrual cycle: Secondary | ICD-10-CM | POA: Insufficient documentation

## 2014-01-30 DIAGNOSIS — N83209 Unspecified ovarian cyst, unspecified side: Secondary | ICD-10-CM | POA: Insufficient documentation

## 2014-01-30 DIAGNOSIS — N911 Secondary amenorrhea: Secondary | ICD-10-CM

## 2014-01-30 DIAGNOSIS — Z87891 Personal history of nicotine dependence: Secondary | ICD-10-CM | POA: Insufficient documentation

## 2014-01-30 DIAGNOSIS — N912 Amenorrhea, unspecified: Secondary | ICD-10-CM | POA: Insufficient documentation

## 2014-01-30 DIAGNOSIS — K219 Gastro-esophageal reflux disease without esophagitis: Secondary | ICD-10-CM | POA: Insufficient documentation

## 2014-01-30 DIAGNOSIS — R109 Unspecified abdominal pain: Secondary | ICD-10-CM | POA: Insufficient documentation

## 2014-01-30 LAB — URINALYSIS, ROUTINE W REFLEX MICROSCOPIC
Bilirubin Urine: NEGATIVE
Glucose, UA: NEGATIVE mg/dL
Hgb urine dipstick: NEGATIVE
KETONES UR: NEGATIVE mg/dL
LEUKOCYTES UA: NEGATIVE
NITRITE: NEGATIVE
PROTEIN: NEGATIVE mg/dL
Specific Gravity, Urine: 1.015 (ref 1.005–1.030)
UROBILINOGEN UA: 0.2 mg/dL (ref 0.0–1.0)
pH: 6.5 (ref 5.0–8.0)

## 2014-01-30 LAB — POCT PREGNANCY, URINE: PREG TEST UR: NEGATIVE

## 2014-01-30 NOTE — Discharge Instructions (Signed)
Keep your scheduled appointment at the office. Call Dr. Garwin Brothers with further concerns.

## 2014-01-30 NOTE — MAU Provider Note (Signed)
History     Chief Complaint  Patient presents with  . Abdominal Cramping   48 yo G0 MBF presents with complaint of worsening lower abdominal pain. Pain started Fri and became progressively worse. No assoc n/v/urinary sx. Pain was sharp, intermittent and was a 10 on arrival and is now a 1. Pt also reports no cycle since 09/2013. Hx of endometrial ablation with one episode of hematometra treated in the OR. Pt had some hot flashes. Pt thought her cycle was coming when the pain started but has had no bleeding  OB History   Grav Para Term Preterm Abortions TAB SAB Ect Mult Living                  Past Medical History  Diagnosis Date  . Ovarian cyst, right   . PONV (postoperative nausea and vomiting)   . GERD (gastroesophageal reflux disease)   . Headache(784.0)   . Anxiety   . Dysrhythmia     pt states she wore a holter in distant past for pvc-told d/t caffeine intake    Past Surgical History  Procedure Laterality Date  . Mym    . Myomectomy    . Ablation    . Diagnostic laparoscopy    . Dilatation & currettage/hysteroscopy with resectocope N/A 01/28/2013    Procedure: DILATATION & CURETTAGE/HYSTEROSCOPY WITH RESECTOCOPE resection of submucosal fibroid;  Surgeon: Marvene Staff, MD;  Location: [facility];  Service: Gynecology;  Laterality: N/A;   diagnostic hysteroscopy with resection of submucosal fibroid  . Tubal ligation      on right side only,   . Unilateral salpingectomy    . Left oophorectomy      Family History  Problem Relation Age of Onset  . Hypertension Mother     History  Substance Use Topics  . Smoking status: Former Smoker -- 0.25 packs/day for 10 years    Types: Cigarettes    Quit date: 01/21/1993  . Smokeless tobacco: Never Used  . Alcohol Use: Yes     Comment: has at least glass of wine every day; whiskey daily    Allergies:  Allergies  Allergen Reactions  . Metronidazole Other (See Comments)    headache  . Percocet [Oxycodone-Acetaminophen]  Nausea And Vomiting    "out of her mind"  . Tetracyclines & Related Nausea Only    Prescriptions prior to admission  Medication Sig Dispense Refill  . naproxen sodium (ANAPROX) 550 MG tablet Take 1 tablet (550 mg total) by mouth daily as needed (pelvic pain).  60 tablet  4     Physical Exam   Blood pressure 122/70, pulse 79, temperature 98.7 F (37.1 C), temperature source Oral, resp. rate 18, height 5\' 3"  (1.6 m), weight 65.772 kg (145 lb), last menstrual period 09/29/2013.  General appearance: alert, cooperative and no distress Lungs: clear to auscultation bilaterally Heart: regular rate and rhythm, S1, S2 normal, no murmur, click, rub or gallop Abdomen: soft, non-tender; bowel sounds normal; no masses,  no organomegaly Pelvic: cervix normal in appearance, external genitalia normal, no adnexal masses or tenderness, no cervical motion tenderness, uterus normal size, shape, and consistency and vagina normal without discharge Extremities: no edema, redness or tenderness in the calves or thighs ED Course  Pelvic pain 2nd amenorrhea P) d/c home. F/u office Friday for [facility], sonogram to r/o hematometra Cont naprosyn   MDM Jj Enyeart A, MD 6:52 PM 01/30/2014

## 2014-01-30 NOTE — MAU Note (Signed)
Pt presents with complaints of lower abdominal cramping since last Thursday. She states she has a long history of GYN issues and hasn't started her menstrual cycle and is concerned.

## 2014-02-21 IMAGING — CT CT ABD-PELV W/O CM
1 series · 14 of 32 positions shown, 17 images · non-contrast
Comparison: None.

CLINICAL DATA: Left lower abdominal pain.  Previous   uterine
ablation.

CT ABDOMEN AND PELVIS WITHOUT CONTRAST
TECHNIQUE: Multidetector CT imaging of the abdomen and pelvis was
performed following the standard protocol without intravenous
contrast.

[Series 6: sagittal · sagittal · 0.66mm/px · 14 of 108 slices shown, 17 images]
[im 4/108  lung]
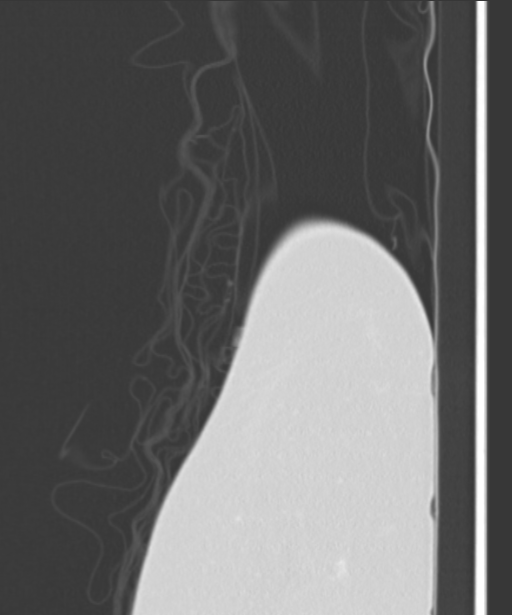
[im 7/108  soft-tissue]
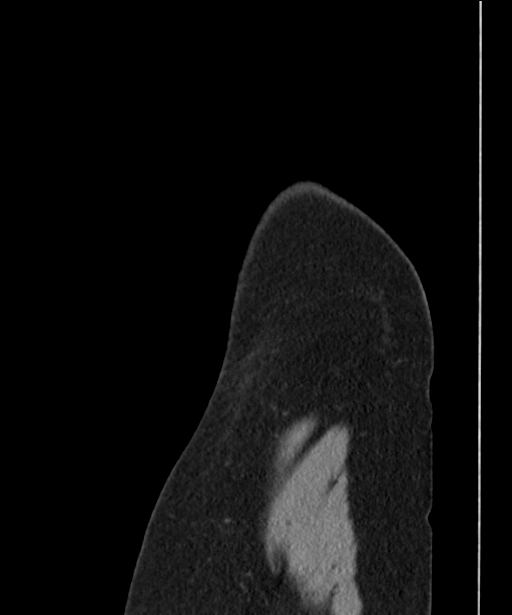
[im 7/108  lung]
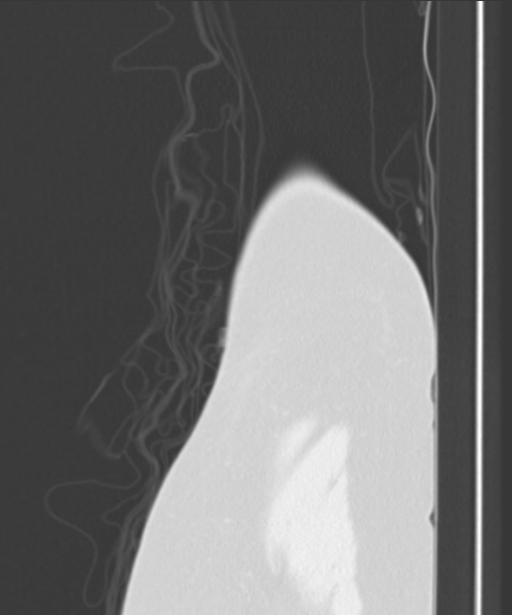
[im 7/108  bone]
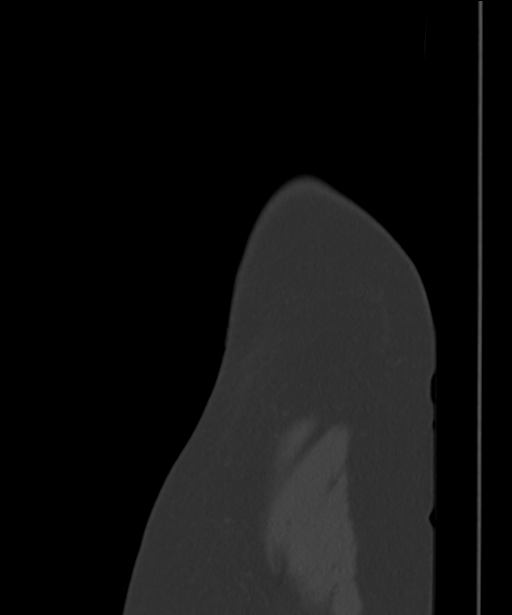
[im 11/108  lung]
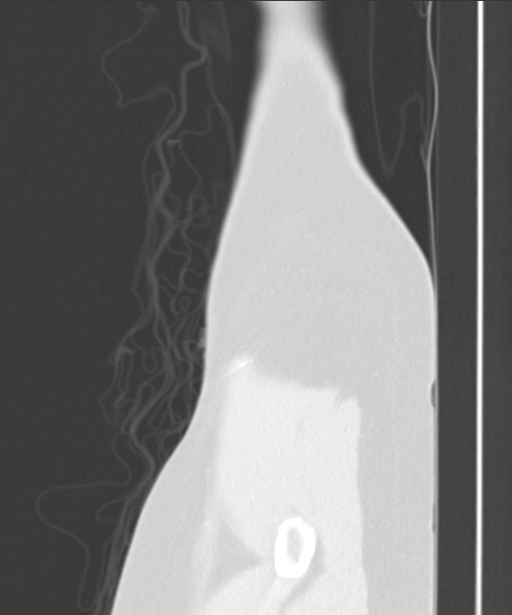
[im 14/108  lung]
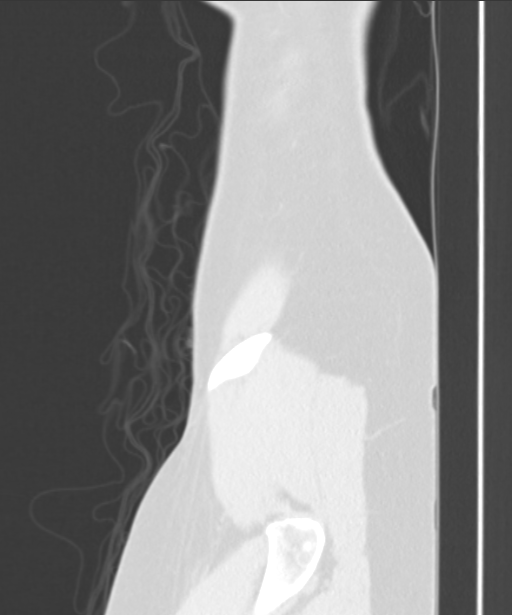
[im 18/108  soft-tissue]
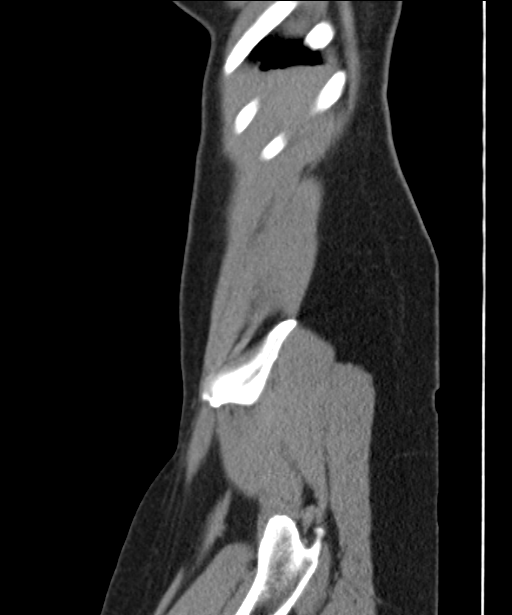
[im 25/108  soft-tissue]
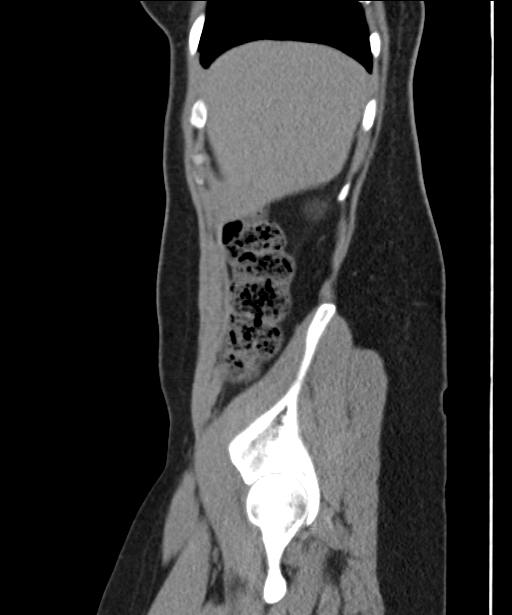
[im 35/108  soft-tissue]
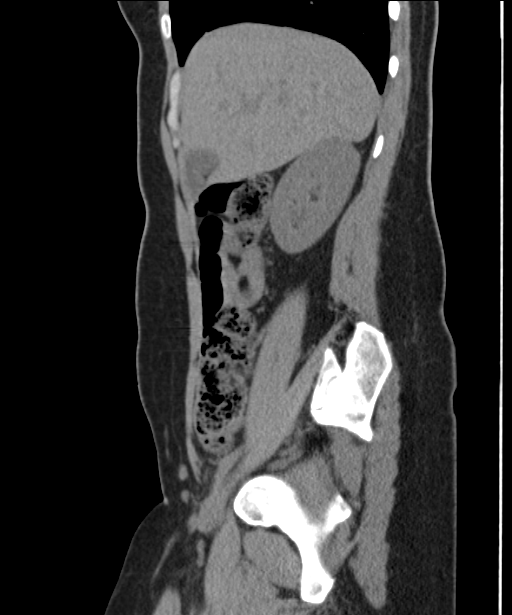
[im 45/108  soft-tissue]
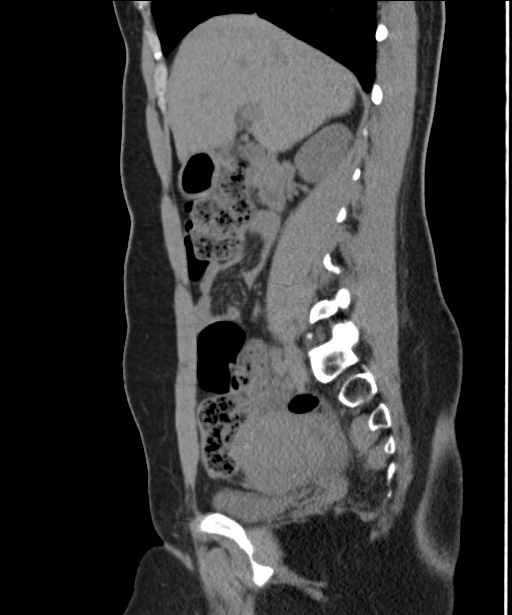
[im 56/108  soft-tissue]
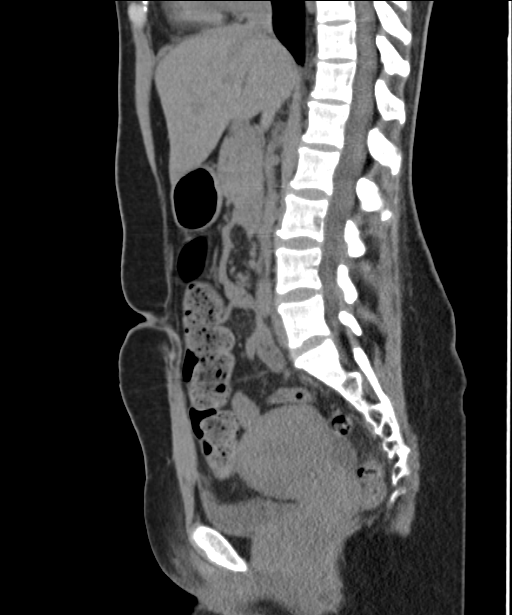
[im 63/108  soft-tissue]
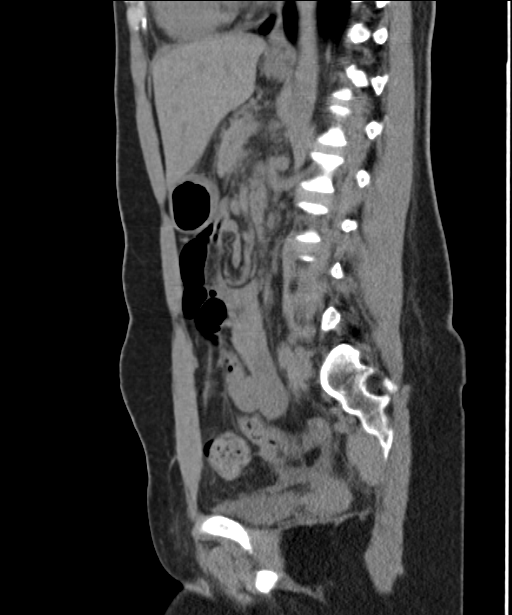
[im 73/108  soft-tissue]
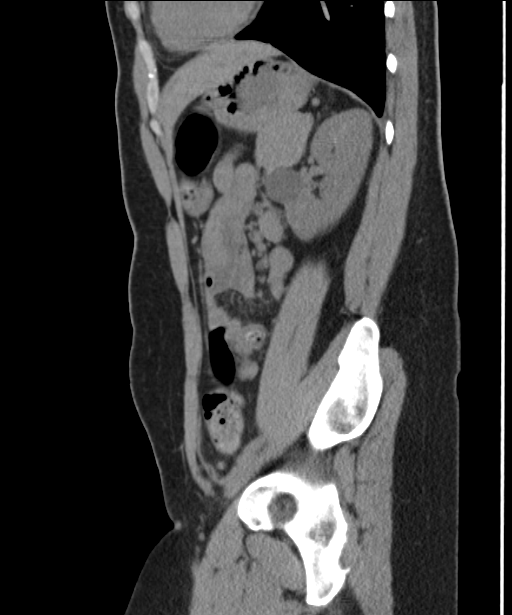
[im 83/108  soft-tissue]
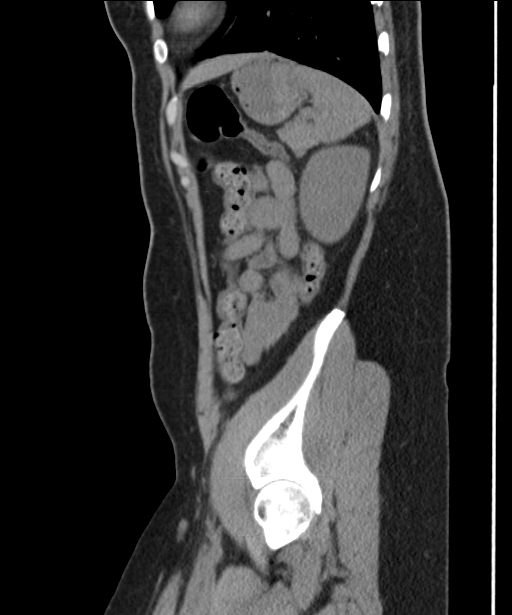
[im 83/108  bone]
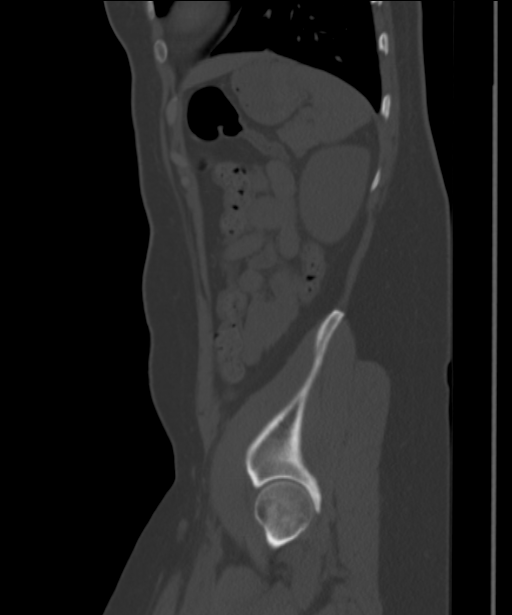
[im 90/108  soft-tissue]
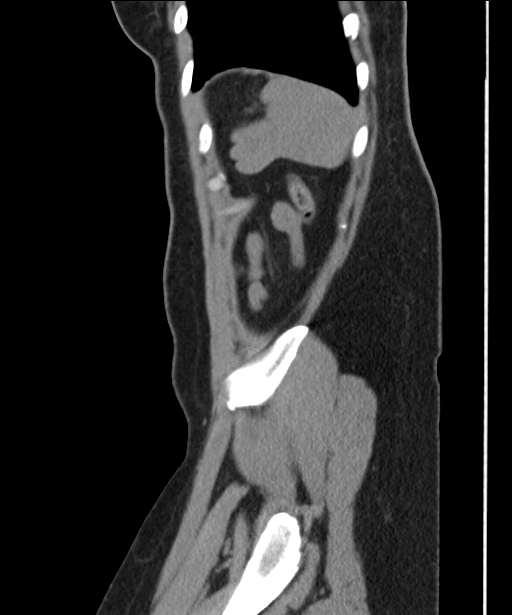
[im 101/108  soft-tissue]
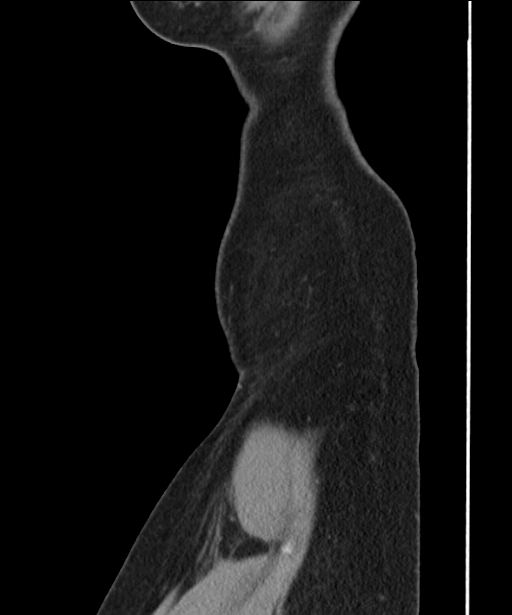

[14 of 32 positions shown; findings below may reference images not displayed]

FINDINGS: Visualized lung bases clear.  Unremarkable liver,
gallbladder, spleen, adrenal glands, right kidney.  2.6 cm
exophytic fluid attenuation lesion from the interpolar region left
kidney probably cyst, incompletely characterized.  No
hydronephrosis or nephrolithiasis.  Unremarkable pancreas.  Aorta
normal in caliber.  Stomach, small bowel, and colon are nondilated.
Normal appendix.  Uterus is somewhat globular and enlarged.  There
is a small amount of fluid in the cul-de-sac.  Adnexal regions
grossly unremarkable.  Urinary bladder is nondistended.
IMPRESSION: 1.  No acute abdominal process.
2. Myomatous uterus.
3.  Small amount of free pelvic fluid, which can be physiologic in
menstruating females.

## 2014-02-28 ENCOUNTER — Other Ambulatory Visit: Payer: Self-pay | Admitting: Obstetrics and Gynecology

## 2014-03-01 NOTE — Patient Instructions (Addendum)
   Your procedure is scheduled on:  Friday, April 3  Enter through the [employer] [facility] at: 6 AM Pick up the phone at the desk and dial [phone] and inform us of your arrival.  Please call this number if you have any problems the morning of surgery: [phone]  Remember: Do not eat or drink after midnight: Thursday Take these medicines the morning of surgery with a SIP OF WATER:  omeprazole  Do not wear jewelry, make-up, or FINGER nail polish No metal in your hair or on your body. Do not wear lotions, powders, perfumes.  You may wear deodorant.  Do not bring valuables to the hospital. Contacts, dentures or bridgework may not be worn into surgery.  Leave suitcase in the car. After Surgery it may be brought to your room. For patients being admitted to the hospital, checkout time is 11:00am the day of discharge.    Patients discharged on the day of surgery will not be allowed to drive home.  Home with husband Lennette Bihari cell [phone].

## 2014-03-02 ENCOUNTER — Encounter (HOSPITAL_COMMUNITY)
Admission: RE | Admit: 2014-03-02 | Discharge: 2014-03-02 | Disposition: A | Discharge status: Home / Self Care | Payer: Managed Care, Other (non HMO) | Source: Ambulatory Visit | Attending: Obstetrics and Gynecology | Admitting: Obstetrics and Gynecology

## 2014-03-02 ENCOUNTER — Encounter (INDEPENDENT_AMBULATORY_CARE_PROVIDER_SITE_OTHER): Payer: Self-pay

## 2014-03-02 ENCOUNTER — Encounter (HOSPITAL_COMMUNITY): Payer: Self-pay

## 2014-03-02 DIAGNOSIS — Z01812 Encounter for preprocedural laboratory examination: Secondary | ICD-10-CM | POA: Insufficient documentation

## 2014-03-02 HISTORY — DX: Anemia, unspecified: D64.9

## 2014-03-02 HISTORY — DX: Herpesviral infection, unspecified: B00.9

## 2014-03-02 LAB — BASIC METABOLIC PANEL
BUN: 18 mg/dL (ref 6–23)
CALCIUM: 9.5 mg/dL (ref 8.4–10.5)
CHLORIDE: 102 meq/L (ref 96–112)
CO2: 26 mEq/L (ref 19–32)
CREATININE: 0.69 mg/dL (ref 0.50–1.10)
Glucose, Bld: 97 mg/dL (ref 70–99)
Potassium: 4.1 mEq/L (ref 3.7–5.3)
Sodium: 139 mEq/L (ref 137–147)

## 2014-03-02 LAB — CBC
HEMATOCRIT: 37 % (ref 36.0–46.0)
Hemoglobin: 12.4 g/dL (ref 12.0–15.0)
MCH: 30 pg (ref 26.0–34.0)
MCHC: 33.5 g/dL (ref 30.0–36.0)
MCV: 89.6 fL (ref 78.0–100.0)
PLATELETS: 229 10*3/uL (ref 150–400)
RBC: 4.13 MIL/uL (ref 3.87–5.11)
RDW: 14.1 % (ref 11.5–15.5)
WBC: 5.5 10*3/uL (ref 4.0–10.5)

## 2014-03-10 ENCOUNTER — Encounter (HOSPITAL_COMMUNITY)
Admission: RE | Disposition: A | Discharge status: Home / Self Care | Payer: Self-pay | Source: Ambulatory Visit | Attending: Obstetrics and Gynecology

## 2014-03-10 ENCOUNTER — Encounter (HOSPITAL_COMMUNITY): Payer: Managed Care, Other (non HMO) | Admitting: Anesthesiology

## 2014-03-10 ENCOUNTER — Ambulatory Visit (HOSPITAL_COMMUNITY)
Admission: RE | Admit: 2014-03-10 | Discharge: 2014-03-11 | Disposition: A | Discharge status: Home / Self Care | Payer: Managed Care, Other (non HMO) | Source: Ambulatory Visit | Attending: Obstetrics and Gynecology | Admitting: Obstetrics and Gynecology

## 2014-03-10 ENCOUNTER — Encounter (HOSPITAL_COMMUNITY): Payer: Self-pay | Admitting: Anesthesiology

## 2014-03-10 ENCOUNTER — Ambulatory Visit (HOSPITAL_COMMUNITY): Payer: Managed Care, Other (non HMO) | Admitting: Anesthesiology

## 2014-03-10 DIAGNOSIS — N838 Other noninflammatory disorders of ovary, fallopian tube and broad ligament: Secondary | ICD-10-CM | POA: Insufficient documentation

## 2014-03-10 DIAGNOSIS — K219 Gastro-esophageal reflux disease without esophagitis: Secondary | ICD-10-CM | POA: Insufficient documentation

## 2014-03-10 DIAGNOSIS — N803 Endometriosis of pelvic peritoneum, unspecified: Secondary | ICD-10-CM | POA: Insufficient documentation

## 2014-03-10 DIAGNOSIS — Z90710 Acquired absence of both cervix and uterus: Secondary | ICD-10-CM | POA: Diagnosis present

## 2014-03-10 DIAGNOSIS — D252 Subserosal leiomyoma of uterus: Secondary | ICD-10-CM | POA: Insufficient documentation

## 2014-03-10 DIAGNOSIS — N8000 Endometriosis of the uterus, unspecified: Secondary | ICD-10-CM | POA: Insufficient documentation

## 2014-03-10 DIAGNOSIS — N949 Unspecified condition associated with female genital organs and menstrual cycle: Secondary | ICD-10-CM | POA: Insufficient documentation

## 2014-03-10 DIAGNOSIS — Z87891 Personal history of nicotine dependence: Secondary | ICD-10-CM | POA: Insufficient documentation

## 2014-03-10 DIAGNOSIS — D251 Intramural leiomyoma of uterus: Secondary | ICD-10-CM | POA: Insufficient documentation

## 2014-03-10 DIAGNOSIS — N857 Hematometra: Secondary | ICD-10-CM | POA: Insufficient documentation

## 2014-03-10 DIAGNOSIS — I499 Cardiac arrhythmia, unspecified: Secondary | ICD-10-CM | POA: Insufficient documentation

## 2014-03-10 HISTORY — PX: UNILATERAL SALPINGECTOMY: SHX6160

## 2014-03-10 HISTORY — PX: ROBOTIC ASSISTED TOTAL HYSTERECTOMY: SHX6085

## 2014-03-10 LAB — CBC
HCT: 37.8 % (ref 36.0–46.0)
Hemoglobin: 12.5 g/dL (ref 12.0–15.0)
MCH: 29.8 pg (ref 26.0–34.0)
MCHC: 33.1 g/dL (ref 30.0–36.0)
MCV: 90.2 fL (ref 78.0–100.0)
Platelets: 232 10*3/uL (ref 150–400)
RBC: 4.19 MIL/uL (ref 3.87–5.11)
RDW: 13.9 % (ref 11.5–15.5)
WBC: 12.7 10*3/uL — ABNORMAL HIGH (ref 4.0–10.5)

## 2014-03-10 LAB — BASIC METABOLIC PANEL
BUN: 8 mg/dL (ref 6–23)
CALCIUM: 8.7 mg/dL (ref 8.4–10.5)
CO2: 22 meq/L (ref 19–32)
CREATININE: 0.58 mg/dL (ref 0.50–1.10)
Chloride: 96 mEq/L (ref 96–112)
Glucose, Bld: 144 mg/dL — ABNORMAL HIGH (ref 70–99)
Potassium: 4.3 mEq/L (ref 3.7–5.3)
SODIUM: 132 meq/L — AB (ref 137–147)

## 2014-03-10 SURGERY — ROBOTIC ASSISTED TOTAL HYSTERECTOMY
Anesthesia: General | Site: Abdomen | Laterality: Right

## 2014-03-10 MED ORDER — CEFAZOLIN SODIUM-DEXTROSE 2-3 GM-% IV SOLR
INTRAVENOUS | Status: AC
  Filled 2014-03-10: qty 50

## 2014-03-10 MED ORDER — MIDAZOLAM HCL 2 MG/2ML IJ SOLN
INTRAMUSCULAR | Status: DC | PRN
  Administered 2014-03-10: 2 mg via INTRAVENOUS

## 2014-03-10 MED ORDER — HYDROMORPHONE HCL 2 MG PO TABS
2.0000 mg | ORAL_TABLET | ORAL | Status: DC | PRN
Start: 2014-03-10 — End: 2014-03-11
  Administered 2014-03-10 – 2014-03-11 (×2): 2 mg via ORAL
  Filled 2014-03-10 (×2): qty 1

## 2014-03-10 MED ORDER — BUPIVACAINE HCL (PF) 0.25 % IJ SOLN
INTRAMUSCULAR | Status: DC | PRN
  Administered 2014-03-10: 90 mL

## 2014-03-10 MED ORDER — PROPOFOL 10 MG/ML IV BOLUS
INTRAVENOUS | Status: DC | PRN
  Administered 2014-03-10: 200 mg via INTRAVENOUS

## 2014-03-10 MED ORDER — FENTANYL CITRATE 0.05 MG/ML IJ SOLN
INTRAMUSCULAR | Status: DC | PRN
  Administered 2014-03-10: 100 ug via INTRAVENOUS
  Administered 2014-03-10: 50 ug via INTRAVENOUS
  Administered 2014-03-10: 150 ug via INTRAVENOUS
  Administered 2014-03-10: 100 ug via INTRAVENOUS

## 2014-03-10 MED ORDER — LACTATED RINGERS IR SOLN
Status: DC | PRN
  Administered 2014-03-10: 3000 mL

## 2014-03-10 MED ORDER — IBUPROFEN 800 MG PO TABS: 800.0000 mg | ORAL_TABLET | Freq: Three times a day (TID) | ORAL | Status: DC | PRN

## 2014-03-10 MED ORDER — ROPIVACAINE HCL 5 MG/ML IJ SOLN
INTRAMUSCULAR | Status: AC
  Filled 2014-03-10: qty 60

## 2014-03-10 MED ORDER — HYDROMORPHONE HCL PF 1 MG/ML IJ SOLN
0.2000 mg | INTRAMUSCULAR | Status: DC | PRN
  Administered 2014-03-10: 0.6 mg via INTRAVENOUS
  Filled 2014-03-10: qty 1

## 2014-03-10 MED ORDER — LIDOCAINE HCL (CARDIAC) 20 MG/ML IV SOLN
INTRAVENOUS | Status: DC | PRN
  Administered 2014-03-10: 50 mg via INTRAVENOUS

## 2014-03-10 MED ORDER — PANTOPRAZOLE SODIUM 40 MG PO TBEC
40.0000 mg | DELAYED_RELEASE_TABLET | Freq: Every day | ORAL | Status: DC
  Filled 2014-03-10 (×2): qty 1

## 2014-03-10 MED ORDER — SCOPOLAMINE 1 MG/3DAYS TD PT72
MEDICATED_PATCH | TRANSDERMAL | Status: AC
  Administered 2014-03-10: 1.5 mg via TRANSDERMAL
  Filled 2014-03-10: qty 1

## 2014-03-10 MED ORDER — ROCURONIUM BROMIDE 100 MG/10ML IV SOLN
INTRAVENOUS | Status: AC
  Filled 2014-03-10: qty 1

## 2014-03-10 MED ORDER — FENTANYL CITRATE 0.05 MG/ML IJ SOLN
INTRAMUSCULAR | Status: AC
  Filled 2014-03-10: qty 5

## 2014-03-10 MED ORDER — ONDANSETRON HCL 4 MG PO TABS: 4.0000 mg | ORAL_TABLET | Freq: Four times a day (QID) | ORAL | Status: DC | PRN

## 2014-03-10 MED ORDER — ROPIVACAINE HCL 5 MG/ML IJ SOLN
INTRAMUSCULAR | Status: AC
  Filled 2014-03-10: qty 30

## 2014-03-10 MED ORDER — METOCLOPRAMIDE HCL 5 MG/ML IJ SOLN
INTRAMUSCULAR | Status: AC
  Filled 2014-03-10: qty 2

## 2014-03-10 MED ORDER — KETOROLAC TROMETHAMINE 30 MG/ML IJ SOLN: 30.0000 mg | Freq: Four times a day (QID) | INTRAMUSCULAR | Status: DC

## 2014-03-10 MED ORDER — CEFAZOLIN SODIUM-DEXTROSE 2-3 GM-% IV SOLR
2.0000 g | INTRAVENOUS | Status: AC
  Administered 2014-03-10: 2 g via INTRAVENOUS

## 2014-03-10 MED ORDER — ACETAMINOPHEN 10 MG/ML IV SOLN
1000.0000 mg | Freq: Once | INTRAVENOUS | Status: AC
  Administered 2014-03-10: 1000 mg via INTRAVENOUS
  Filled 2014-03-10: qty 100

## 2014-03-10 MED ORDER — MEPERIDINE HCL 25 MG/ML IJ SOLN: 6.2500 mg | INTRAMUSCULAR | Status: DC | PRN

## 2014-03-10 MED ORDER — PROPOFOL 10 MG/ML IV EMUL
INTRAVENOUS | Status: AC
  Filled 2014-03-10: qty 20

## 2014-03-10 MED ORDER — MENTHOL 3 MG MT LOZG: 1.0000 | LOZENGE | OROMUCOSAL | Status: DC | PRN

## 2014-03-10 MED ORDER — FENTANYL CITRATE 0.05 MG/ML IJ SOLN: 25.0000 ug | INTRAMUSCULAR | Status: DC | PRN

## 2014-03-10 MED ORDER — SODIUM CHLORIDE 0.9 % IJ SOLN
INTRAMUSCULAR | Status: DC | PRN
  Administered 2014-03-10: 10 mL

## 2014-03-10 MED ORDER — LIDOCAINE HCL (CARDIAC) 20 MG/ML IV SOLN
INTRAVENOUS | Status: AC
  Filled 2014-03-10: qty 5

## 2014-03-10 MED ORDER — KETOROLAC TROMETHAMINE 30 MG/ML IJ SOLN: 15.0000 mg | Freq: Once | INTRAMUSCULAR | Status: DC | PRN

## 2014-03-10 MED ORDER — GLYCOPYRROLATE 0.2 MG/ML IJ SOLN
INTRAMUSCULAR | Status: DC | PRN
  Administered 2014-03-10: 0.6 mg via INTRAVENOUS

## 2014-03-10 MED ORDER — DEXAMETHASONE SODIUM PHOSPHATE 10 MG/ML IJ SOLN
INTRAMUSCULAR | Status: DC | PRN
  Administered 2014-03-10: 6 mg via INTRAVENOUS

## 2014-03-10 MED ORDER — KETOROLAC TROMETHAMINE 30 MG/ML IJ SOLN
INTRAMUSCULAR | Status: DC | PRN
  Administered 2014-03-10: 30 mg via INTRAVENOUS

## 2014-03-10 MED ORDER — SODIUM CHLORIDE 0.9 % IJ SOLN
INTRAMUSCULAR | Status: AC
Start: 2014-03-10 — End: 2014-03-10
  Filled 2014-03-10: qty 10

## 2014-03-10 MED ORDER — ONDANSETRON HCL 4 MG/2ML IJ SOLN
4.0000 mg | Freq: Four times a day (QID) | INTRAMUSCULAR | Status: DC | PRN
Start: 2014-03-10 — End: 2014-03-11
  Administered 2014-03-10: 4 mg via INTRAVENOUS
  Filled 2014-03-10: qty 2

## 2014-03-10 MED ORDER — NEOSTIGMINE METHYLSULFATE 1 MG/ML IJ SOLN
INTRAMUSCULAR | Status: AC
  Filled 2014-03-10: qty 1

## 2014-03-10 MED ORDER — LACTATED RINGERS IV SOLN
INTRAVENOUS | Status: DC
  Administered 2014-03-10 (×2): via INTRAVENOUS

## 2014-03-10 MED ORDER — ONDANSETRON HCL 4 MG/2ML IJ SOLN
INTRAMUSCULAR | Status: AC
  Filled 2014-03-10: qty 2

## 2014-03-10 MED ORDER — ROCURONIUM BROMIDE 100 MG/10ML IV SOLN
INTRAVENOUS | Status: DC | PRN
  Administered 2014-03-10: 20 mg via INTRAVENOUS
  Administered 2014-03-10: 50 mg via INTRAVENOUS

## 2014-03-10 MED ORDER — GLYCOPYRROLATE 0.2 MG/ML IJ SOLN
INTRAMUSCULAR | Status: AC
  Filled 2014-03-10: qty 3

## 2014-03-10 MED ORDER — DEXTROSE IN LACTATED RINGERS 5 % IV SOLN
INTRAVENOUS | Status: DC
  Administered 2014-03-10: 16:00:00 via INTRAVENOUS

## 2014-03-10 MED ORDER — METOCLOPRAMIDE HCL 5 MG/ML IJ SOLN
10.0000 mg | Freq: Once | INTRAMUSCULAR | Status: AC | PRN
  Administered 2014-03-10: 10 mg via INTRAVENOUS

## 2014-03-10 MED ORDER — KETOROLAC TROMETHAMINE 30 MG/ML IJ SOLN
30.0000 mg | Freq: Four times a day (QID) | INTRAMUSCULAR | Status: DC
Start: 2014-03-10 — End: 2014-03-11
  Administered 2014-03-11: 30 mg via INTRAVENOUS
  Filled 2014-03-10: qty 1

## 2014-03-10 MED ORDER — MIDAZOLAM HCL 2 MG/2ML IJ SOLN
INTRAMUSCULAR | Status: AC
  Filled 2014-03-10: qty 2

## 2014-03-10 MED ORDER — SCOPOLAMINE 1 MG/3DAYS TD PT72
1.0000 | MEDICATED_PATCH | TRANSDERMAL | Status: DC
  Administered 2014-03-10: 1.5 mg via TRANSDERMAL

## 2014-03-10 MED ORDER — SODIUM CHLORIDE 0.9 % IJ SOLN
INTRAMUSCULAR | Status: AC
Start: 2014-03-10 — End: 2014-03-10
  Filled 2014-03-10: qty 100

## 2014-03-10 MED ORDER — NEOSTIGMINE METHYLSULFATE 1 MG/ML IJ SOLN
INTRAMUSCULAR | Status: DC | PRN
  Administered 2014-03-10: 3.5 mg via INTRAVENOUS

## 2014-03-10 MED ORDER — KETOROLAC TROMETHAMINE 30 MG/ML IJ SOLN
INTRAMUSCULAR | Status: AC
  Filled 2014-03-10: qty 1

## 2014-03-10 MED ORDER — ONDANSETRON HCL 4 MG/2ML IJ SOLN
INTRAMUSCULAR | Status: DC | PRN
  Administered 2014-03-10: 4 mg via INTRAVENOUS

## 2014-03-10 SURGICAL SUPPLY — 53 items
BAG URINE DRAINAGE (UROLOGICAL SUPPLIES) ×5 IMPLANT
BARRIER ADHS 3X4 INTERCEED (GAUZE/BANDAGES/DRESSINGS) ×10 IMPLANT
CATH FOLEY 3WAY  5CC 16FR (CATHETERS) ×2
CATH FOLEY 3WAY 5CC 16FR (CATHETERS) ×3 IMPLANT
CHLORAPREP W/TINT 26ML (MISCELLANEOUS) ×5 IMPLANT
CLOTH BEACON ORANGE TIMEOUT ST (SAFETY) ×5 IMPLANT
CONT PATH 16OZ SNAP LID 3702 (MISCELLANEOUS) ×5 IMPLANT
CONTAINER PREFILL 10% NBF 15ML (MISCELLANEOUS) ×5 IMPLANT
CONTAINER PREFILL 10% NBF 60ML (FORM) ×5 IMPLANT
COVER MAYO STAND STRL (DRAPES) ×5 IMPLANT
COVER TABLE BACK 60X90 (DRAPES) ×10 IMPLANT
COVER TIP SHEARS 8 DVNC (MISCELLANEOUS) ×3 IMPLANT
COVER TIP SHEARS 8MM DA VINCI (MISCELLANEOUS) ×2
DECANTER SPIKE VIAL GLASS SM (MISCELLANEOUS) ×5 IMPLANT
DERMABOND ADVANCED (GAUZE/BANDAGES/DRESSINGS) ×2
DERMABOND ADVANCED .7 DNX12 (GAUZE/BANDAGES/DRESSINGS) ×3 IMPLANT
DRAPE HUG U DISPOSABLE (DRAPE) ×5 IMPLANT
DRAPE LG THREE QUARTER DISP (DRAPES) ×10 IMPLANT
DRAPE WARM FLUID 44X44 (DRAPE) ×5 IMPLANT
ELECT REM PT RETURN 9FT ADLT (ELECTROSURGICAL) ×5
ELECTRODE REM PT RTRN 9FT ADLT (ELECTROSURGICAL) ×3 IMPLANT
EVACUATOR SMOKE 8.L (FILTER) ×5 IMPLANT
GLOVE BIOGEL PI IND STRL 7.0 (GLOVE) ×6 IMPLANT
GLOVE BIOGEL PI INDICATOR 7.0 (GLOVE) ×4
GLOVE ECLIPSE 6.5 STRL STRAW (GLOVE) ×20 IMPLANT
GOWN STRL REUS W/TWL LRG LVL3 (GOWN DISPOSABLE) ×30 IMPLANT
KIT ACCESSORY DA VINCI DISP (KITS) ×2
KIT ACCESSORY DVNC DISP (KITS) ×3 IMPLANT
LEGGING LITHOTOMY PAIR STRL (DRAPES) ×5 IMPLANT
OCCLUDER COLPOPNEUMO (BALLOONS) ×15 IMPLANT
PACK LAVH (CUSTOM PROCEDURE TRAY) ×5 IMPLANT
PAD PREP 24X48 CUFFED NSTRL (MISCELLANEOUS) ×10 IMPLANT
PLUG CATH AND CAP STER (CATHETERS) ×5 IMPLANT
PROTECTOR NERVE ULNAR (MISCELLANEOUS) ×10 IMPLANT
SET CYSTO W/LG BORE CLAMP LF (SET/KITS/TRAYS/PACK) IMPLANT
SET IRRIG TUBING LAPAROSCOPIC (IRRIGATION / IRRIGATOR) ×5 IMPLANT
SOLUTION ELECTROLUBE (MISCELLANEOUS) ×5 IMPLANT
SUT VIC AB 0 CT1 36 (SUTURE) ×10 IMPLANT
SUT VICRYL 0 UR6 27IN ABS (SUTURE) ×5 IMPLANT
SUT VICRYL 4-0 PS2 18IN ABS (SUTURE) ×10 IMPLANT
SUT VLOC 180 2-0 6IN GS21 (SUTURE) ×15 IMPLANT
SUT VLOC 180 2-0 9IN GS21 (SUTURE) ×5 IMPLANT
SYR 50ML LL SCALE MARK (SYRINGE) ×5 IMPLANT
SYRINGE 10CC LL (SYRINGE) ×5 IMPLANT
TIP UTERINE 5.1X6CM LAV DISP (MISCELLANEOUS) ×5 IMPLANT
TIP UTERINE 6.7X8CM BLUE DISP (MISCELLANEOUS) ×5 IMPLANT
TOWEL OR 17X24 6PK STRL BLUE (TOWEL DISPOSABLE) ×15 IMPLANT
TROCAR DISP BLADELESS 8 DVNC (TROCAR) ×3 IMPLANT
TROCAR DISP BLADELESS 8MM (TROCAR) ×2
TROCAR Z-THREAD 12X150 (TROCAR) ×5 IMPLANT
TUBING FILTER THERMOFLATOR (ELECTROSURGICAL) ×5 IMPLANT
WARMER LAPAROSCOPE (MISCELLANEOUS) ×5 IMPLANT
WATER STERILE IRR 1000ML POUR (IV SOLUTION) ×15 IMPLANT

## 2014-03-10 NOTE — Addendum Note (Signed)
Addendum created 03/10/14 1452 by Billie Lade, CRNA   Modules edited: Notes Section   Notes Section:  File: [id number]

## 2014-03-10 NOTE — Anesthesia Postprocedure Evaluation (Signed)
  Anesthesia Post-op Note  Anesthesia Post Note  Patient: Kristina [name]  Procedure(s) Performed: Procedure(s) (LRB): ROBOTIC ASSISTED TOTAL HYSTERECTOMY (N/A) UNILATERAL SALPINGECTOMY (Right) EXCISION OF ENDOMETRIOMA (N/A)  Anesthesia type: General  Patient location: [facility]  Post pain: Pain level controlled  Post assessment: Post-op Vital signs reviewed  Last Vitals:  Filed Vitals:   03/10/14 1400  BP: 118/66  Pulse: 74  Temp: 36.4 C  Resp: 15    Post vital signs: Reviewed  Level of consciousness: sedated  Complications: No apparent anesthesia complications

## 2014-03-10 NOTE — Anesthesia Postprocedure Evaluation (Signed)
  Anesthesia Post-op Note  Patient: Kristina [name]  Procedure(s) Performed: Procedure(s): ROBOTIC ASSISTED TOTAL HYSTERECTOMY (N/A) UNILATERAL SALPINGECTOMY (Right) EXCISION OF ENDOMETRIOMA (N/A)  Patient Location: PACU  Anesthesia Type:General  Level of Consciousness: awake, alert  and oriented  Airway and Oxygen Therapy: Patient Spontanous Breathing  Post-op Pain: mild   Post-op Assessment: Post-op Vital signs reviewed, Patient's Cardiovascular Status Stable, Respiratory Function Stable, Patent Airway, No signs of Nausea or vomiting and Pain level controlled  Post-op Vital Signs: Reviewed and stable  Complications: No apparent anesthesia complications

## 2014-03-10 NOTE — Anesthesia Procedure Notes (Signed)
Procedure Name: Intubation Date/Time: 03/10/2014 7:40 AM Performed by: Jonna Munro Pre-anesthesia Checklist: Patient identified, Emergency Drugs available, Suction available, Patient being monitored and Timeout performed Patient Re-evaluated:Patient Re-evaluated prior to inductionOxygen Delivery Method: Circle system utilized Preoxygenation: Pre-oxygenation with 100% oxygen Intubation Type: IV induction Ventilation: Mask ventilation without difficulty Laryngoscope Size: Mac and 3 Grade View: Grade III Tube type: Oral Tube size: 7.0 mm Number of attempts: 3 Airway Equipment and Method: Stylet Placement Confirmation: ETT inserted through vocal cords under direct vision,  positive ETCO2 and breath sounds checked- equal and bilateral Secured at: 21 cm Tube secured with: Tape Dental Injury: Teeth and Oropharynx as per pre-operative assessment  Difficulty Due To: Difficulty was unanticipated Comments: See quick note @ 0740.

## 2014-03-10 NOTE — Transfer of Care (Signed)
Immediate Anesthesia Transfer of Care Note  Patient: Kristina [name]  Procedure(s) Performed: Procedure(s): ROBOTIC ASSISTED TOTAL HYSTERECTOMY (N/A) UNILATERAL SALPINGECTOMY (Right) EXCISION OF ENDOMETRIOMA (N/A)  Patient Location: PACU  Anesthesia Type:General  Level of Consciousness: awake, alert  and oriented  Airway & Oxygen Therapy: Patient Spontanous Breathing and Patient connected to nasal cannula oxygen  Post-op Assessment: Report given to PACU RN and Post -op Vital signs reviewed and stable  Post vital signs: Reviewed and stable  Complications: No apparent anesthesia complications

## 2014-03-10 NOTE — Brief Op Note (Signed)
03/10/2014  11:11 AM  PATIENT:  Kristina [name]  48 y.o. female  PRE-OPERATIVE DIAGNOSIS:  Pelvic Pain; Hematometra, uterine fibroids, hx endometrial ablation  POST-OPERATIVE DIAGNOSIS:  Pelvic Pain; Hematometra, uterine fibroids, hx endometrial ablation, stage II pelvic endometriosis  PROCEDURE:  DaVinci robotic total hysterectomy, right salpingectomy, excision of pelvic endometriosis  SURGEON:  Surgeon(s) and Role:    * Mikya Don Clint Bolder, MD - Primary  PHYSICIAN ASSISTANT:   ASSISTANTS: Artelia Laroche, CNM   ANESTHESIA:   general, paracervical block  FINDINGS: Fibroid uterus, surgical absent left ovary and tube, nl right ovary and tubes, bladder peritoneal endometriosis with bladder adhesions, 2- 2.5 cm left pelvic endometriotic implant on left ovarian fossa overlying ureter, right posterior ovarian fossa endometriosis, ureters dissected out and peristalsing bilaterally, left IP noted Nl liver  Edge  EBL:  Total I/O In: 1800 [I.V.:1800] Out: 400 [Urine:300; Blood:100]  BLOOD ADMINISTERED:none  DRAINS: none   LOCAL MEDICATIONS USED:  BUPIVICAINE   SPECIMEN:  Source of Specimen:  uterus with cervix, right tube,excision  of pelvic endometriosis  DISPOSITION OF SPECIMEN:  PATHOLOGY  COUNTS:  YES  TOURNIQUET:  * No tourniquets in log *  DICTATION: .Other Dictation: Dictation Number [id number]  PLAN OF CARE: Admit for overnight observation  PATIENT DISPOSITION:  PACU - hemodynamically stable.   Delay start of Pharmacological VTE agent (>24hrs) due to surgical blood loss or risk of bleeding: no

## 2014-03-10 NOTE — Anesthesia Preprocedure Evaluation (Signed)
Anesthesia Evaluation  Patient identified by MRN, date of birth, ID band Patient awake    Reviewed: Allergy & Precautions, H&P , NPO status , Patient's Chart, lab work & pertinent test results, reviewed documented beta blocker date and time   History of Anesthesia Complications (+) PONV  Airway Mallampati: III TM Distance: >3 FB Neck ROM: full    Dental  (+) Teeth Intact   Pulmonary neg pulmonary ROS, former smoker,  breath sounds clear to auscultation  Pulmonary exam normal       Cardiovascular + dysrhythmias (PVCs with caffeine) Rhythm:regular Rate:Normal     Neuro/Psych  Headaches, Anxiety    GI/Hepatic Neg liver ROS, GERD-  Medicated,  Endo/Other  negative endocrine ROS  Renal/GU negative Renal ROS  Female GU complaint     Musculoskeletal   Abdominal   Peds  Hematology negative hematology ROS (+)   Anesthesia Other Findings   Reproductive/Obstetrics negative OB ROS                           Anesthesia Physical Anesthesia Plan  ASA: II  Anesthesia Plan: General ETT   Post-op Pain Management:    Induction:   Airway Management Planned:   Additional Equipment:   Intra-op Plan:   Post-operative Plan:   Informed Consent: I have reviewed the patients History and Physical, chart, labs and discussed the procedure including the risks, benefits and alternatives for the proposed anesthesia with the patient or authorized representative who has indicated his/her understanding and acceptance.   Dental Advisory Given  Plan Discussed with: CRNA and Surgeon  Anesthesia Plan Comments:         Anesthesia Quick Evaluation

## 2014-03-11 MED ORDER — IBUPROFEN 800 MG PO TABS: 800.0000 mg | ORAL_TABLET | Freq: Three times a day (TID) | ORAL | Status: DC | PRN

## 2014-03-11 NOTE — Op Note (Signed)
Kristina [name], Kristina [name]              ACCOUNT NO.:  [id number]  MEDICAL RECORD NO.:  [id number]  LOCATION:  9306                          FACILITY:  [facility] [place] [facility]  PHYSICIAN:  Servando Salina, M.D.DATE OF BIRTH:  Sep 26, 1966  DATE OF PROCEDURE:  03/10/2014 DATE OF DISCHARGE:                              OPERATIVE REPORT   PREOPERATIVE DIAGNOSES:  Pelvic pain, hematometra, history of endometrial ablation, uterine fibroids.  POSTOPERATIVE DIAGNOSES:  Pelvic pain, hematometra, uterine fibroids, history of endometrial ablation, stage II pelvic endometriosis.  PROCEDURE:  Da Vinci robotic total hysterectomy, right salpingectomy, excision of  Stage II pelvic endometriosis.  SURGEON:  Servando Salina, M.D.  ASSISTANT:  Artelia Laroche, C.N.M.  ANESTHESIA:  General, paracervical block.  DESCRIPTION OF PROCEDURE:  Under adequate general anesthesia, the patient was placed in the dorsal lithotomy position.  She was positioned for robotic surgery.  Examination under anesthesia revealed an irregular about 10-week size uterus.  No adnexal masses could be appreciated.  The patient was sterilely prepped and draped in usual fashion.  A three-way Foley catheter was sterilely placed.  A weighted speculum was placed in the vagina.  Sims retractor was placed anteriorly.  A 0 Vicryl figure-of- eight suture was placed on the anteroposterior lip of the cervix.  The cervix was initially sounded to about 5-6 cm, therefore a medium-sized RUMI cup with #6 manipulator was introduced into the uterine cavity. However, the balloon ruptured and insufflation.  The manipulator was removed.  The cavity was then further dilated and sounded to about 7.5 cm and a #8 uterine manipulator was then placed with good position. Once that was done, the retractors were removed.  Attention was then turned to the abdomen.  Small supraumbilical incision was then made. Veress needle was introduced, tested with normal saline.   Opening pressure of 9 was noted.  Two liters of CO2 was insufflated.  Veress needle was then removed.  A 12-mm disposable trocar with sleeve was introduced into the abdomen without incident.  The lighted robotic camera was then inserted through that port.  Entry into to the abdomen was without incident.  The patient was gently placed in Trendelenburg position.  The uterus was noted to be irregular consistent with uterine fibroids.  There was adhesion of the bladder peritoneum up on the anterior aspect of the uterus.  The left ovary and tube were surgically absent.  The right tube and ovary could be seen was otherwise normal. Normal liver edge was noted.  Positions were then placed.  Two 8-mm robotic port sites were placed in the right and left lower quadrant and in the right upper quadrant between the camera and the right lower quadrant site was an assistant 5-mm port placed all under direct visualization.  The robot was then docked to the patient's left and in arm #1, the monopolar scissor was placed and arm #2, the PK dissector was then placed.  Bupivacaine 60 mL was placed in the abdomen.  The paracervical block with bupivacaine was also done.  Attention was then turned to the surgical console.  At the surgical console, the pelvis was inspected.  The ureter was seen bilaterally peristalsing.  On the left, there was a patch  of endometriosis overlying the posterior fossa and the ureter on the left.  There was adhesions anteriorly with two satellite endometriotic lesions and puckering of endometriotic implants overlying the bladder Peritoneum in the midline and to the patient's left.  There was also endometriosis in the posterior fossa on the right and in the left posterior cul-de-sac.  The procedure was started by sharp dissection and the lysis of those adhesions, resection of the bladder peritoneum endometriotic implants anteriorly.  The bladder was then further displaced inferiorly  with opening of the transverse of the bladder peritoneum.  Once this was done, attention was turned to the right side. The right retroperitoneal space was opened.  A ureter was then found in the retroperitoneal space.  The ureter course was identified in order to excise the right posterior ovarian fossa endometriotic lesion, this was done.  The round ligament was identified, clamped, cauterized, and then cut.  The uterine vessels were then skeletonized.  The posterior leaf of the broad ligament was opened posteriorly and displaced inferiorly.  The fallopian tube was grasped on the right and the underlying mesosalpinx was serially clamped, cauterized, and then cut until the tube was removed.  There was a window made in the retroperitoneal medial leaf, which was resulted in the right utero-ovarian ligament being serially clamped, cauterized, and then cut, and the right ovary being displaced away from the surgical field.  The ovary itself had no endometriotic implants.  The ureter clearly in view and peristalsing under the tunnel as noted.  Attention was then turned to the opposite side.  In the posterior cul-de-sac, the endometriotic implant was excised.  The left retroperitoneal space was opened and dissected.  The ureter was identified the medial leaf.  The left internal iliac artery was then identified.  The ureter was then sharply dissected away from the peritoneum medially until the area of the endometriosis was isolated. Then, endometriosis was then excised from the posterior ovarian fossa. There was still a puckering within the fact area above that area of excised endometriosis and this puckering was also excised with sharp dissection.  Continued dissection was done on the left side until the branch of the uterine artery from the internal iliac was found and isolated.  Then, the uterine artery was clamped, cauterized and then cut.  Uterine vessels were then further skeletonized on the  left side of the uterus.  The round ligament on the left was clamped, cauterized, and then cut.  The bladder peritoneum was then sharply dissected off the RUMI cup and displaced inferiorly.  Small bleeder was cauterized.  Anteriorly, the RUMI cup was identified.  The cervicovaginal junction was then opened transversely and posteriorly was opened transversely as well.  At that point, once they were both equally opened, then the uterine vessels with both the ureters were noted to be visually peristalsing and clearly away from the uterine artery.  The uterine arteries bilaterally were then clamped, cauterized, and cut, and the vaginal attachment to the uterus was then severed.  When this was done, the uterus with cervix was removed as was the right tube through the vagina.  Of note, then the left IP ligament was still noted to be present.  It was clamped, cauterized and cut at the left pelvic brim to go with the specimen.  Once this was all done, there was bleeding in the posterior vaginal cuff, which was cauterized. Small bleeding in the right angle was then also clamped and cauterized.  Using 2-0 V-Loc suture  x2, angle sutures were placed bilaterally and running stitch of the V-Loc suture was then done across and overlapping.  This was done with care taken to bring up the vaginal portion of the mucosa.  The abdomen was then irrigated and suctioned.  The sutures were removed.  Interceed was placed over the raw surfaces on both sides and bupivacaine was again placed and instilled in the abdomen.  Under direct visualization, the lower ports were removed. The supraumbilical site was removed taking care not to bring up any underlying structure.  Abdomen was deflated.  The patient was taken out of Trendelenburg.  Rectus fascia was then identified in the suprapubic site and placed with 0 Vicryl figure-of-eight sutures being placed, and the other incisions, overall subcuticular closure with 4-0  Vicryl suture.  Vaginal cuff was inspected during the procedure of closure and was again inspected postprocedure by myself and visually looked with speculum.  SPECIMEN:  Uterus with cervix, right tube and excision of pelvic endometriosis, all sent to Pathology.  ESTIMATED BLOOD LOSS:  100 mL.  INTRAOPERATIVE FLUID:  1800 mL.  URINE OUTPUT:  200 mL clear yellow urine.  COUNTS:  Sponge and instrument counts x2 were correct.  COMPLICATION:  None.  The patient tolerated the procedure well, was transferred to the recovery room in stable condition.     Servando Salina, M.D.     [place]/MEDQ  D:  03/10/2014  T:  03/11/2014  Job:  [id number]

## 2014-03-11 NOTE — Progress Notes (Signed)
Subjective: Patient reports incisional pain, tolerating PO and no problems voiding.    Objective: I have reviewed patient's vital signs.  vital signs, intake and output and labs. Filed Vitals:   03/11/14 0537  BP: 119/65  Pulse: 74  Temp: 98.3 F (36.8 C)  Resp: 18   I/O last 3 completed shifts: In: 3215.4 [P.O.:630; I.V.:2585.4] Out: 1675 [Urine:1575; Blood:100]    Lab Results  Component Value Date   WBC 12.7* 03/10/2014   HGB 12.5 03/10/2014   HCT 37.8 03/10/2014   MCV 90.2 03/10/2014   PLT 232 03/10/2014   Lab Results  Component Value Date   CREATININE 0.58 03/10/2014    EXAM General: alert, cooperative and no distress Resp: clear to auscultation bilaterally Cardio: regular rate and rhythm, S1, S2 normal, no murmur, click, rub or gallop GI: soft, non-tender; bowel sounds normal; no masses,  no organomegaly and incision: well approximated. (+) dermabond Extremities: no edema, redness or tenderness in the calves or thighs Vaginal Bleeding: none  Assessment: s/p Procedure(s): ROBOTIC ASSISTED TOTAL HYSTERECTOMY RIGHT SALPINGECTOMY EXCISION OF PELVIC ENDOMETRIOSIS  Plan: Advance diet Encourage ambulation Advance to PO medication Discharge home D/c instructions reviewed. Pt has vicodin at home F/u 2 weeks   LOS: 1 day    Jaiya Mooradian A, MD 03/11/2014 7:17 AM    03/11/2014, 7:17 AM

## 2014-03-11 NOTE — Discharge Instructions (Signed)
Call if temperature greater than equal to 100.4, nothing per vagina for 4-6 weeks or severe nausea vomiting, increased incisional pain , drainage or redness in the incision site, no straining with bowel movements, showers no bath °

## 2014-03-11 NOTE — Discharge Summary (Signed)
Physician Discharge Summary  Patient ID: Kristina [name] MRN: [id number] DOB/AGE: Aug 09, 1966 48 y.o.  Admit date: 03/10/2014 Discharge date: 03/11/2014  Admission Diagnoses: pelvic pain, hx endometrial ablation, uterine fibroid, hematometra  Discharge Diagnoses: pelvic pain, hx endometrial ablation, uterine fibroid, stage II pelvic endometriosis, hematometra  Active Problems:   S/P hysterectomy   Discharged Condition: stable  Hospital Course: Pt was admitted to [facility] and underwent daVinci robotic total hysterectomy, right salpingectomy, excision of  Stage II pelvic endometriosis. Pt had uncomplicated postop course  Consults: None  Significant Diagnostic Studies: labs:  CBC    Component Value Date/Time   WBC 12.7* 03/10/2014 1709   RBC 4.19 03/10/2014 1709   HGB 12.5 03/10/2014 1709   HCT 37.8 03/10/2014 1709   PLT 232 03/10/2014 1709   MCV 90.2 03/10/2014 1709   MCH 29.8 03/10/2014 1709   MCHC 33.1 03/10/2014 1709   RDW 13.9 03/10/2014 1709    BMET    Component Value Date/Time   NA 132* 03/10/2014 1709   K 4.3 03/10/2014 1709   CL 96 03/10/2014 1709   CO2 22 03/10/2014 1709   GLUCOSE 144* 03/10/2014 1709   BUN 8 03/10/2014 1709   CREATININE 0.58 03/10/2014 1709   CALCIUM 8.7 03/10/2014 1709   GFRNONAA >90 03/10/2014 1709   GFRAA >90 03/10/2014 1709      Treatments: surgery: Da Vinci robotic total hysterectomy, right salpingectomy, excision of stage II pelvic endometriosis  Discharge Exam: Blood pressure 119/65, pulse 74, temperature 98.3 F (36.8 C), temperature source Oral, resp. rate 18, height 5\' 3"  (1.6 m), weight 62.596 kg (138 lb), SpO2 98.00%. General appearance: alert, cooperative and no distress Resp: clear to auscultation bilaterally Cardio: regular rate and rhythm, S1, S2 normal, no murmur, click, rub or gallop GI: soft, non-tender; bowel sounds normal; no masses,  no organomegaly and incision: small w/ dermabond. no erythema/induration or exudate Pelvic: deferred Extremities: no  edema, redness or tenderness in the calves or thighs Incision/Wound:  Disposition: 01-Home or Self Care  Discharge Orders   Future Orders Complete By Expires   Diet general  As directed    Discharge instructions  As directed    Comments:     Call if temperature greater than equal to 100.4, nothing per vagina for 4-6 weeks or severe nausea vomiting, increased incisional pain , drainage or redness in the incision site, no straining with bowel movements, showers no bath   Increase activity slowly  As directed        Medication List    STOP taking these medications       HYDROcodone-acetaminophen 5-325 MG per tablet  Commonly known as:  NORCO/VICODIN     naproxen sodium 550 MG tablet  Commonly known as:  ANAPROX     omeprazole 20 MG capsule  Commonly known as:  PRILOSEC      TAKE these medications       GOODY HEADACHE PO  Take 1 Package by mouth as needed (For headache).     ibuprofen 800 MG tablet  Commonly known as:  ADVIL,MOTRIN  Take 1 tablet (800 mg total) by mouth every 8 (eight) hours as needed (mild pain).     NASACORT AQ 55 MCG/ACT Aero nasal inhaler  Generic drug:  triamcinolone  Place 2 sprays into the nose daily.     valACYclovir 500 MG tablet  Commonly known as:  VALTREX  Take 1,000 mg by mouth 2 (two) times daily as needed.  Follow-up Information   Follow up with Darrel Gloss A, MD On 03/27/2014.   Specialty:  Obstetrics and Gynecology   Contact information:   [address] [place] [address] [phone]       Signed: Servando Salina A 03/11/2014, 7:23 AM

## 2014-03-11 NOTE — Progress Notes (Signed)
Discharge instructions reviewed with patient.  Patient states understanding of home care.  No home equipment needed.  Patient ambulated for discharge in stable condition with staff without incident. 

## 2014-03-13 ENCOUNTER — Encounter (HOSPITAL_COMMUNITY): Payer: Self-pay | Admitting: Obstetrics and Gynecology

## 2014-07-14 ENCOUNTER — Ambulatory Visit (INDEPENDENT_AMBULATORY_CARE_PROVIDER_SITE_OTHER)
Admission: RE | Admit: 2014-07-14 | Discharge: 2014-07-14 | Disposition: A | Discharge status: Home / Self Care | Payer: BC Managed Care – PPO | Source: Ambulatory Visit | Attending: Internal Medicine | Admitting: Internal Medicine

## 2014-07-14 ENCOUNTER — Encounter (INDEPENDENT_AMBULATORY_CARE_PROVIDER_SITE_OTHER): Payer: Self-pay

## 2014-07-14 ENCOUNTER — Ambulatory Visit (INDEPENDENT_AMBULATORY_CARE_PROVIDER_SITE_OTHER): Payer: BC Managed Care – PPO | Admitting: Internal Medicine

## 2014-07-14 ENCOUNTER — Encounter: Payer: Self-pay | Admitting: Internal Medicine

## 2014-07-14 VITALS — BP 108/70 | HR 80 | Temp 98.3°F | Ht 63.0 in | Wt 141.4 lb

## 2014-07-14 DIAGNOSIS — R079 Chest pain, unspecified: Secondary | ICD-10-CM

## 2014-07-14 NOTE — Progress Notes (Signed)
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    History of Present Illness  04/15/2013 1st pulmonary ov cc really bad cough p colds no pattern can last for months never require ov's or abx or prednisone assoc with sense of pnds but more of a dry day time cough and no sob - actually better x 2 weeks spontaneously 2 weeks prior to OV  But wanted an opinion on how to get better quicker p next flare which typically will occur no more than once a year.  Onset is always abrupt with viral like uri as was the case on this occasion. rec Try prilosec 20mg   Take 30-60 min before first meal of the day and Pepcid 20 mg one bedtime until cough is completely gone for at least a week without the need for cough suppression. GERD diet >  Completely resolved     07/14/2014 f/u ov/Kristina [name] re: new R CP back > front s pain with cough or deep breath. Chief Complaint  Patient presents with  . Follow-up    Pt states "I want to make sure I don't have lung cancer".  She states that when she drinks something cold she has pain in her left side.   1st noted 07/09/14 p unusual work out / happens with any cold liquid, hasn't tried cold solid eg ice cream  No assoc pleuritic features/ not sob / does ok with workouts since onset but gen sore over post chest with her new routine, esp afterwards and early in am with stiffness esp in back.  No nausea, sob  No obvious day to day or daytime variabilty or assoc   chest tightness, subjective wheeze overt sinus or hb symptoms. No unusual exp hx or h/o childhood pna/ asthma or knowledge of premature birth.  Sleeping ok without nocturnal  or early am exacerbation  of respiratory  c/o's or need for noct saba. Also  denies any obvious fluctuation of symptoms with weather or environmental changes or other aggravating or alleviating factors except as outlined above   Current Medications, Allergies, Complete Past Medical History, Past Surgical History, Family History, and Social History were reviewed in [employer] record.  ROS  The following are not active complaints unless bolded sore throat, dysphagia, dental problems, itching, sneezing,  nasal congestion or excess/ purulent secretions, ear ache,   fever, chills, sweats, unintended wt loss, pleuritic or exertional cp, hemoptysis,  orthopnea pnd or leg swelling, presyncope, palpitations, heartburn, abdominal pain, anorexia, nausea, vomiting, diarrhea  or change in bowel or urinary habits, change in stools or urine, dysuria,hematuria,  rash, arthralgias, visual complaints, headache, numbness weakness or ataxia or problems with walking or coordination,  change in mood/affect or memory.               Objective:   Physical Exam  amb bf nad 07/14/2014         141 Wt Readings from Last 3 Encounters:  04/15/13 134 lb 12.8 oz (61.145 kg)  01/21/13 138 lb 6 oz (62.766 kg)  01/21/13 138 lb 6 oz (62.766 kg)   HEENT: nl dentition, turbinates, and orophanx. Nl external ear canals without cough reflex   NECK :  without JVD/Nodes/TM/ nl carotid upstrokes bilaterally   LUNGS: no acc muscle use, clear to A and P bilaterally without cough on insp or exp maneuvers   CV:  RRR  no s3 or murmur or increase in P2, no edema   ABD:  soft and nontender with nl excursion in the supine position. No bruits or organomegaly, bowel sounds nl  MS:  warm without deformities, calf tenderness, cyanosis or clubbing  SKIN: warm and dry without lesions    NEURO:  alert, approp, no deficits     CXR  07/14/2014 :  Lungs are clear. Heart size and pulmonary vascularity are normal. No  adenopathy. No pneumothorax. No bone lesions. There is slight mid   thoracic dextroscoliosis.          Assessment & Plan:

## 2014-07-14 NOTE — Patient Instructions (Addendum)
Please remember to go to the  x-ray department downstairs for your tests - we will call you with the results when they are available.    Omeprazole 20 mg Take 30- 60 min before your first and last meals of the day   See Dr Henrene Pastor if not better in one week

## 2014-07-15 NOTE — Assessment & Plan Note (Addendum)
Very unusual patter of pain while swallowing and a few min afterwards assoc with otherwise typical mscp from her new workout routine but nothing to suggest esophageal perforation or dysfunction clinically   rec rechallenge with max acid suppression since prev atypical symptoms resolved p gerd rx, and f/u GI prn   Also discussed new guidelines for lung Ca - since she quit smoking over 20 y ago she doesn't qualify for the low dose CT

## 2014-07-17 NOTE — Progress Notes (Signed)
Quick Note:  Spoke with pt and notified of results per Dr. Wert. Pt verbalized understanding and denied any questions.  ______ 

## 2014-08-07 ENCOUNTER — Encounter: Payer: Self-pay | Admitting: Internal Medicine

## 2014-08-24 ENCOUNTER — Telehealth: Payer: Self-pay | Admitting: Internal Medicine

## 2014-08-24 NOTE — Telephone Encounter (Signed)
Called and spoke with the pt  She states having dull, achy feeling in her chest  She states that used to only occur after swallowing, but now it bothers her more often  She states that she is getting over a cold and has minimal non prod cough  She denies any SOB or other co's  I advised will need ov to evaluate her  She declined 2 different appts I offered her for today  She is aware MW out of the office at this time and prefers to wait until his next avail, stating "It's not that bad it can wait" I scheduled her for 08/30/14 and advised needs call sooner if needed or seek emergent care should her symptoms persist or worsen She verbalized understanding

## 2014-08-30 ENCOUNTER — Ambulatory Visit: Payer: BC Managed Care – PPO | Admitting: Internal Medicine

## 2014-10-10 ENCOUNTER — Telehealth: Payer: Self-pay

## 2014-10-10 ENCOUNTER — Ambulatory Visit: Payer: BC Managed Care – PPO | Admitting: Internal Medicine

## 2014-10-10 NOTE — Telephone Encounter (Signed)
No charge. 

## 2014-10-10 NOTE — Telephone Encounter (Signed)
-----   Message from Marvel Plan sent at 10/10/2014  1:06 PM EST ----- Pt called earlier and stated that he husband was having an eye surgery today and she was needing to re-schedule her appointment.  I made her a new appointment on 12/15/14, but left the one for today on the schedule due to the last minute cancellation.

## 2014-12-07 ENCOUNTER — Encounter: Payer: Self-pay | Admitting: Internal Medicine

## 2014-12-15 ENCOUNTER — Ambulatory Visit: Payer: BC Managed Care – PPO | Admitting: Internal Medicine

## 2015-02-21 LAB — HM COLONOSCOPY

## 2015-03-08 ENCOUNTER — Encounter: Payer: Self-pay | Admitting: Podiatry

## 2015-03-08 ENCOUNTER — Ambulatory Visit (INDEPENDENT_AMBULATORY_CARE_PROVIDER_SITE_OTHER): Payer: BLUE CROSS/BLUE SHIELD | Admitting: Podiatry

## 2015-03-08 VITALS — BP 131/79 | HR 87 | Resp 12 | Ht 63.0 in | Wt 140.0 lb

## 2015-03-08 DIAGNOSIS — L600 Ingrowing nail: Secondary | ICD-10-CM

## 2015-03-08 NOTE — Progress Notes (Signed)
Subjective:     Patient ID: Kristina [name], female   DOB: 1966-06-25, 49 y.o.   MRN: [id number]  HPI patient presents stating I was concerned about this nail on my left big toe that's got a little crack in it and I wanted to make sure that I'm not losing   Review of Systems     Objective:   Physical Exam Neurovascular status intact muscle strength adequate with a small fissure in the left hallux nail medial side in the distal one third with probable trauma which probably created this    Assessment:     Nail damage left hallux distal one third    Plan:     Reviewed condition and explained the condition to patient. I applied a Bobetta Lime to the area to smooth it and there was no longer Fisher noted and we will allow it to grow out. May grow into an ingrown toenail and require correction at one point in the future

## 2015-03-08 NOTE — Progress Notes (Signed)
   Subjective:    Patient ID: Kristina [name], female    DOB: 06-22-1966, 49 y.o.   MRN: [id number]  HPI Comments: Pt states she noticed the left 1st toenail was cracked and is now itchy.     Review of Systems  All other systems reviewed and are negative.      Objective:   Physical Exam        Assessment & Plan:

## 2016-05-12 LAB — HM COLONOSCOPY

## 2017-11-05 ENCOUNTER — Other Ambulatory Visit: Payer: Self-pay | Admitting: Radiology

## 2018-01-05 ENCOUNTER — Other Ambulatory Visit (INDEPENDENT_AMBULATORY_CARE_PROVIDER_SITE_OTHER): Payer: Managed Care, Other (non HMO)

## 2018-01-05 DIAGNOSIS — Z23 Encounter for immunization: Secondary | ICD-10-CM

## 2018-02-04 ENCOUNTER — Other Ambulatory Visit (INDEPENDENT_AMBULATORY_CARE_PROVIDER_SITE_OTHER): Payer: Managed Care, Other (non HMO)

## 2018-02-04 DIAGNOSIS — Z23 Encounter for immunization: Secondary | ICD-10-CM

## 2018-02-05 ENCOUNTER — Other Ambulatory Visit: Payer: Managed Care, Other (non HMO)

## 2018-03-08 ENCOUNTER — Encounter (HOSPITAL_COMMUNITY): Payer: Self-pay | Admitting: Emergency Medicine

## 2018-03-08 ENCOUNTER — Emergency Department (HOSPITAL_COMMUNITY)
Admission: EM | Admit: 2018-03-08 | Discharge: 2018-03-08 | Disposition: A | Discharge status: Home / Self Care | Payer: Managed Care, Other (non HMO) | Attending: Emergency Medicine | Admitting: Emergency Medicine

## 2018-03-08 ENCOUNTER — Emergency Department (HOSPITAL_COMMUNITY): Payer: Managed Care, Other (non HMO)

## 2018-03-08 DIAGNOSIS — R079 Chest pain, unspecified: Secondary | ICD-10-CM | POA: Insufficient documentation

## 2018-03-08 DIAGNOSIS — Z5321 Procedure and treatment not carried out due to patient leaving prior to being seen by health care provider: Secondary | ICD-10-CM | POA: Insufficient documentation

## 2018-03-08 LAB — BASIC METABOLIC PANEL
Anion gap: 8 (ref 5–15)
BUN: 21 mg/dL — ABNORMAL HIGH (ref 6–20)
CHLORIDE: 108 mmol/L (ref 101–111)
CO2: 23 mmol/L (ref 22–32)
Calcium: 9.2 mg/dL (ref 8.9–10.3)
Creatinine, Ser: 0.73 mg/dL (ref 0.44–1.00)
GFR calc Af Amer: >60 mL/min (ref >60)
GFR calc non Af Amer: >60 mL/min (ref >60)
Glucose, Bld: 108 mg/dL — ABNORMAL HIGH (ref 65–99)
Potassium: 4 mmol/L (ref 3.5–5.1)
Sodium: 139 mmol/L (ref 135–145)

## 2018-03-08 LAB — CBC
HEMATOCRIT: 38 % (ref 36.0–46.0)
HEMOGLOBIN: 12.3 g/dL (ref 12.0–15.0)
MCH: 28.8 pg (ref 26.0–34.0)
MCHC: 32.4 g/dL (ref 30.0–36.0)
MCV: 89 fL (ref 78.0–100.0)
Platelets: 247 10*3/uL (ref 150–400)
RBC: 4.27 MIL/uL (ref 3.87–5.11)
RDW: 14 % (ref 11.5–15.5)
WBC: 6.8 10*3/uL (ref 4.0–10.5)

## 2018-03-08 LAB — I-STAT BETA HCG BLOOD, ED (MC, WL, AP ONLY): I-stat hCG, quantitative: <5 m[IU]/mL (ref <5)

## 2018-03-08 LAB — I-STAT TROPONIN, ED: Troponin i, poc: 0 ng/mL (ref 0.00–0.08)

## 2018-03-08 NOTE — ED Notes (Signed)
Pt states that she is no longer having chest pain and "thinks it was just heartburn." Pt instructed that we advise she stays to see a provider. Pt states she understands the risks and want to go home.

## 2018-03-08 NOTE — ED Triage Notes (Signed)
Pt reports CP X30 min, mid with radiation to back, 6/10, sharp in nature. Denies SOB, N/V, dizziness/lightheadedness. No cardiac hx, states hx of GERD

## 2019-02-23 LAB — HM PAP SMEAR

## 2019-07-07 DIAGNOSIS — M25511 Pain in right shoulder: Secondary | ICD-10-CM | POA: Diagnosis not present

## 2019-07-07 DIAGNOSIS — M542 Cervicalgia: Secondary | ICD-10-CM | POA: Diagnosis not present

## 2019-07-14 DIAGNOSIS — M542 Cervicalgia: Secondary | ICD-10-CM | POA: Diagnosis not present

## 2019-07-14 DIAGNOSIS — M47892 Other spondylosis, cervical region: Secondary | ICD-10-CM | POA: Diagnosis not present

## 2019-07-14 DIAGNOSIS — M5412 Radiculopathy, cervical region: Secondary | ICD-10-CM | POA: Diagnosis not present

## 2019-07-28 DIAGNOSIS — M47892 Other spondylosis, cervical region: Secondary | ICD-10-CM | POA: Diagnosis not present

## 2019-07-28 DIAGNOSIS — M542 Cervicalgia: Secondary | ICD-10-CM | POA: Diagnosis not present

## 2019-07-28 DIAGNOSIS — M5412 Radiculopathy, cervical region: Secondary | ICD-10-CM | POA: Diagnosis not present

## 2019-08-02 DIAGNOSIS — R7301 Impaired fasting glucose: Secondary | ICD-10-CM | POA: Diagnosis not present

## 2019-08-02 DIAGNOSIS — Z0000 Encounter for general adult medical examination without abnormal findings: Secondary | ICD-10-CM | POA: Diagnosis not present

## 2019-08-02 DIAGNOSIS — E7800 Pure hypercholesterolemia, unspecified: Secondary | ICD-10-CM | POA: Diagnosis not present

## 2019-08-09 DIAGNOSIS — Z0000 Encounter for general adult medical examination without abnormal findings: Secondary | ICD-10-CM | POA: Diagnosis not present

## 2019-08-09 DIAGNOSIS — E7800 Pure hypercholesterolemia, unspecified: Secondary | ICD-10-CM | POA: Diagnosis not present

## 2019-08-09 DIAGNOSIS — M542 Cervicalgia: Secondary | ICD-10-CM | POA: Diagnosis not present

## 2019-08-09 DIAGNOSIS — R7301 Impaired fasting glucose: Secondary | ICD-10-CM | POA: Diagnosis not present

## 2019-08-09 DIAGNOSIS — Z1331 Encounter for screening for depression: Secondary | ICD-10-CM | POA: Diagnosis not present

## 2019-08-17 ENCOUNTER — Telehealth: Payer: Self-pay | Admitting: Emergency Medicine

## 2019-08-17 NOTE — Telephone Encounter (Signed)
Called patient to schedule appt. Mailbox was full. Ok to schedule New patient appt per Dr Ronnald Ramp.

## 2019-08-17 NOTE — Telephone Encounter (Signed)
Yes, I will see her  TJ 

## 2019-08-17 NOTE — Telephone Encounter (Signed)
Pt is requesting to become a new patient. Is that ok with you?

## 2019-08-25 DIAGNOSIS — Z1212 Encounter for screening for malignant neoplasm of rectum: Secondary | ICD-10-CM | POA: Diagnosis not present

## 2019-09-14 DIAGNOSIS — Z1231 Encounter for screening mammogram for malignant neoplasm of breast: Secondary | ICD-10-CM | POA: Diagnosis not present

## 2019-09-14 DIAGNOSIS — Z01419 Encounter for gynecological examination (general) (routine) without abnormal findings: Secondary | ICD-10-CM | POA: Diagnosis not present

## 2019-09-14 DIAGNOSIS — Z6827 Body mass index (BMI) 27.0-27.9, adult: Secondary | ICD-10-CM | POA: Diagnosis not present

## 2019-09-15 DIAGNOSIS — R5383 Other fatigue: Secondary | ICD-10-CM | POA: Diagnosis not present

## 2019-09-15 DIAGNOSIS — R7301 Impaired fasting glucose: Secondary | ICD-10-CM | POA: Diagnosis not present

## 2019-09-15 DIAGNOSIS — R635 Abnormal weight gain: Secondary | ICD-10-CM | POA: Diagnosis not present

## 2019-09-15 DIAGNOSIS — E785 Hyperlipidemia, unspecified: Secondary | ICD-10-CM | POA: Diagnosis not present

## 2019-09-15 DIAGNOSIS — E559 Vitamin D deficiency, unspecified: Secondary | ICD-10-CM | POA: Diagnosis not present

## 2019-09-15 DIAGNOSIS — N951 Menopausal and female climacteric states: Secondary | ICD-10-CM | POA: Diagnosis not present

## 2019-09-20 DIAGNOSIS — Z1331 Encounter for screening for depression: Secondary | ICD-10-CM | POA: Diagnosis not present

## 2019-09-20 DIAGNOSIS — R7301 Impaired fasting glucose: Secondary | ICD-10-CM | POA: Diagnosis not present

## 2019-09-20 DIAGNOSIS — E785 Hyperlipidemia, unspecified: Secondary | ICD-10-CM | POA: Diagnosis not present

## 2019-09-20 DIAGNOSIS — E559 Vitamin D deficiency, unspecified: Secondary | ICD-10-CM | POA: Diagnosis not present

## 2019-09-20 DIAGNOSIS — Z1339 Encounter for screening examination for other mental health and behavioral disorders: Secondary | ICD-10-CM | POA: Diagnosis not present

## 2019-09-20 DIAGNOSIS — Z6827 Body mass index (BMI) 27.0-27.9, adult: Secondary | ICD-10-CM | POA: Diagnosis not present

## 2019-09-23 DIAGNOSIS — R7301 Impaired fasting glucose: Secondary | ICD-10-CM | POA: Diagnosis not present

## 2019-09-23 DIAGNOSIS — H1030 Unspecified acute conjunctivitis, unspecified eye: Secondary | ICD-10-CM | POA: Diagnosis not present

## 2019-09-23 DIAGNOSIS — H04123 Dry eye syndrome of bilateral lacrimal glands: Secondary | ICD-10-CM | POA: Diagnosis not present

## 2019-09-23 DIAGNOSIS — Z6828 Body mass index (BMI) 28.0-28.9, adult: Secondary | ICD-10-CM | POA: Diagnosis not present

## 2019-10-04 DIAGNOSIS — H0014 Chalazion left upper eyelid: Secondary | ICD-10-CM | POA: Diagnosis not present

## 2019-10-05 ENCOUNTER — Telehealth: Payer: Self-pay | Admitting: Emergency Medicine

## 2019-10-05 DIAGNOSIS — E785 Hyperlipidemia, unspecified: Secondary | ICD-10-CM | POA: Diagnosis not present

## 2019-10-05 DIAGNOSIS — Z6827 Body mass index (BMI) 27.0-27.9, adult: Secondary | ICD-10-CM | POA: Diagnosis not present

## 2019-10-05 NOTE — Telephone Encounter (Signed)
Error

## 2019-10-13 DIAGNOSIS — R7301 Impaired fasting glucose: Secondary | ICD-10-CM | POA: Diagnosis not present

## 2019-10-13 DIAGNOSIS — Z6827 Body mass index (BMI) 27.0-27.9, adult: Secondary | ICD-10-CM | POA: Diagnosis not present

## 2019-10-13 DIAGNOSIS — E559 Vitamin D deficiency, unspecified: Secondary | ICD-10-CM | POA: Diagnosis not present

## 2019-10-19 DIAGNOSIS — R7301 Impaired fasting glucose: Secondary | ICD-10-CM | POA: Diagnosis not present

## 2019-10-19 DIAGNOSIS — Z6827 Body mass index (BMI) 27.0-27.9, adult: Secondary | ICD-10-CM | POA: Diagnosis not present

## 2019-10-25 DIAGNOSIS — E785 Hyperlipidemia, unspecified: Secondary | ICD-10-CM | POA: Diagnosis not present

## 2019-10-25 DIAGNOSIS — Z6827 Body mass index (BMI) 27.0-27.9, adult: Secondary | ICD-10-CM | POA: Diagnosis not present

## 2019-11-16 DIAGNOSIS — R635 Abnormal weight gain: Secondary | ICD-10-CM | POA: Diagnosis not present

## 2019-11-16 DIAGNOSIS — E559 Vitamin D deficiency, unspecified: Secondary | ICD-10-CM | POA: Diagnosis not present

## 2020-01-30 DIAGNOSIS — L659 Nonscarring hair loss, unspecified: Secondary | ICD-10-CM | POA: Diagnosis not present

## 2020-02-21 ENCOUNTER — Ambulatory Visit: Payer: Managed Care, Other (non HMO) | Admitting: Internal Medicine

## 2020-03-06 ENCOUNTER — Ambulatory Visit (INDEPENDENT_AMBULATORY_CARE_PROVIDER_SITE_OTHER): Payer: BC Managed Care – PPO | Admitting: Internal Medicine

## 2020-03-06 ENCOUNTER — Other Ambulatory Visit: Payer: Self-pay

## 2020-03-06 ENCOUNTER — Encounter: Payer: Self-pay | Admitting: Internal Medicine

## 2020-03-06 VITALS — BP 120/74 | HR 85 | Temp 98.3°F | Resp 16 | Ht 63.0 in | Wt 162.0 lb

## 2020-03-06 DIAGNOSIS — E663 Overweight: Secondary | ICD-10-CM | POA: Insufficient documentation

## 2020-03-06 DIAGNOSIS — R739 Hyperglycemia, unspecified: Secondary | ICD-10-CM | POA: Diagnosis not present

## 2020-03-06 DIAGNOSIS — Z0000 Encounter for general adult medical examination without abnormal findings: Secondary | ICD-10-CM | POA: Insufficient documentation

## 2020-03-06 DIAGNOSIS — Z1231 Encounter for screening mammogram for malignant neoplasm of breast: Secondary | ICD-10-CM | POA: Insufficient documentation

## 2020-03-06 DIAGNOSIS — F418 Other specified anxiety disorders: Secondary | ICD-10-CM | POA: Insufficient documentation

## 2020-03-06 LAB — HEPATIC FUNCTION PANEL
ALT: 13 U/L (ref 0–35)
AST: 14 U/L (ref 0–37)
Albumin: 4.5 g/dL (ref 3.5–5.2)
Alkaline Phosphatase: 86 U/L (ref 39–117)
Bilirubin, Direct: 0.1 mg/dL (ref 0.0–0.3)
Total Bilirubin: 0.5 mg/dL (ref 0.2–1.2)
Total Protein: 7.8 g/dL (ref 6.0–8.3)

## 2020-03-06 LAB — CBC WITH DIFFERENTIAL/PLATELET
Basophils Absolute: 0 10*3/uL (ref 0.0–0.1)
Basophils Relative: 0.6 % (ref 0.0–3.0)
Eosinophils Absolute: 0.1 10*3/uL (ref 0.0–0.7)
Eosinophils Relative: 1.6 % (ref 0.0–5.0)
HCT: 38.2 % (ref 36.0–46.0)
Hemoglobin: 12.6 g/dL (ref 12.0–15.0)
Lymphocytes Relative: 50.5 % — ABNORMAL HIGH (ref 12.0–46.0)
Lymphs Abs: 2.5 10*3/uL (ref 0.7–4.0)
MCHC: 32.9 g/dL (ref 30.0–36.0)
MCV: 88.5 fl (ref 78.0–100.0)
Monocytes Absolute: 0.2 10*3/uL (ref 0.1–1.0)
Monocytes Relative: 4.9 % (ref 3.0–12.0)
Neutro Abs: 2.1 10*3/uL (ref 1.4–7.7)
Neutrophils Relative %: 42.4 % — ABNORMAL LOW (ref 43.0–77.0)
Platelets: 248 10*3/uL (ref 150.0–400.0)
RBC: 4.32 Mil/uL (ref 3.87–5.11)
RDW: 14.8 % (ref 11.5–15.5)
WBC: 4.9 10*3/uL (ref 4.0–10.5)

## 2020-03-06 LAB — BASIC METABOLIC PANEL
BUN: 12 mg/dL (ref 6–23)
CO2: 27 mEq/L (ref 19–32)
Calcium: 9.4 mg/dL (ref 8.4–10.5)
Chloride: 103 mEq/L (ref 96–112)
Creatinine, Ser: 0.61 mg/dL (ref 0.40–1.20)
GFR: 123.39 mL/min (ref >60.00)
Glucose, Bld: 97 mg/dL (ref 70–99)
Potassium: 3.9 mEq/L (ref 3.5–5.1)
Sodium: 136 mEq/L (ref 135–145)

## 2020-03-06 LAB — TSH: TSH: 2.03 u[IU]/mL (ref 0.35–4.50)

## 2020-03-06 LAB — LIPID PANEL
Cholesterol: 223 mg/dL — ABNORMAL HIGH (ref 0–200)
HDL: 72.9 mg/dL (ref >39.00)
LDL Cholesterol: 140 mg/dL — ABNORMAL HIGH (ref 0–99)
NonHDL: 150.12
Total CHOL/HDL Ratio: 3
Triglycerides: 49 mg/dL (ref 0.0–149.0)
VLDL: 9.8 mg/dL (ref 0.0–40.0)

## 2020-03-06 LAB — HEMOGLOBIN A1C: Hgb A1c MFr Bld: 5.7 % (ref 4.6–6.5)

## 2020-03-06 MED ORDER — SAXENDA 18 MG/3ML ~~LOC~~ SOPN: 3.0000 mg | PEN_INJECTOR | Freq: Every day | SUBCUTANEOUS | 5 refills | Status: DC

## 2020-03-06 NOTE — Progress Notes (Signed)
Subjective:  Patient ID: Kristina [name], female    DOB: 1966-09-09  Age: 54 y.o. MRN: [id number]  CC: Annual Exam  This visit occurred during the SARS-CoV-2 public health emergency.  Safety protocols were in place, including screening questions prior to the visit, additional usage of staff PPE, and extensive cleaning of exam room while observing appropriate contact time as indicated for disinfecting solutions.    HPI SAHRA BOLAN presents for a CPX.  She complains of weight gain and has not made much progress with lifestyle modifications. She has no other complaints today.   History Malli has a past medical history of Anemia, Anxiety, Dysrhythmia, GERD (gastroesophageal reflux disease), Headache(784.0), HSV infection, Ovarian cyst, right, and PONV (postoperative nausea and vomiting).   She has a past surgical history that includes mym; Myomectomy; Ablation; Diagnostic laparoscopy; Dilatation & currettage/hysteroscopy with resectoscope (N/A, 01/28/2013); Tubal ligation; Unilateral salpingectomy; Left oophorectomy; Robotic assisted total hysterectomy (N/A, 03/10/2014); and Unilateral salpingectomy (Right, 03/10/2014).   Her family history includes Hypertension in her mother.She reports that she quit smoking about 27 years ago. Her smoking use included cigarettes. She has a 2.50 pack-year smoking history. She has never used smokeless tobacco. She reports current alcohol use of about 14.0 standard drinks of alcohol per week. She reports that she does not use drugs.  Outpatient Medications Prior to Visit  Medication Sig Dispense Refill  . Dexlansoprazole (DEXILANT PO) Take by mouth.    Marland Kitchen ibuprofen (ADVIL,MOTRIN) 800 MG tablet Take 1 tablet (800 mg total) by mouth every 8 (eight) hours as needed (mild pain). 30 tablet 0  . triamcinolone (NASACORT AQ) 55 MCG/ACT AERO nasal inhaler Place 2 sprays into the nose daily as needed.     . valACYclovir (VALTREX) 500 MG tablet Take 1,000 mg by mouth 2  (two) times daily as needed.     No facility-administered medications prior to visit.    ROS Review of Systems  Constitutional: Positive for unexpected weight change. Negative for diaphoresis and fatigue.  HENT: Negative.   Eyes: Negative.   Respiratory: Negative for cough, shortness of breath and wheezing.   Cardiovascular: Negative for chest pain, palpitations and leg swelling.  Gastrointestinal: Negative for abdominal pain, constipation, diarrhea, nausea and vomiting.  Endocrine: Negative.  Negative for cold intolerance and heat intolerance.  Genitourinary: Negative.   Musculoskeletal: Negative.   Skin: Negative.  Negative for color change.  Neurological: Negative.  Negative for dizziness, weakness and headaches.  Hematological: Negative for adenopathy. Does not bruise/bleed easily.  Psychiatric/Behavioral: Negative.     Objective:  BP 120/74 (BP Location: Left Arm, Patient Position: Sitting, Cuff Size: Large)   Pulse 85   Temp 98.3 F (36.8 C) (Oral)   Resp 16   Ht 5\' 3"  (1.6 m)   Wt 162 lb (73.5 kg)   LMP 09/29/2013   SpO2 97%   BMI 28.70 kg/m   Physical Exam Vitals reviewed.  Constitutional:      Appearance: Normal appearance.  HENT:     Nose: Nose normal.     Mouth/Throat:     Mouth: Mucous membranes are moist.  Eyes:     General: No scleral icterus.    Conjunctiva/sclera: Conjunctivae normal.  Cardiovascular:     Rate and Rhythm: Normal rate and regular rhythm.     Heart sounds: No murmur.  Pulmonary:     Effort: Pulmonary effort is normal.     Breath sounds: No stridor. No wheezing, rhonchi or rales.  Abdominal:  General: Abdomen is protuberant. Bowel sounds are normal. There is no distension.     Palpations: Abdomen is soft. There is no hepatomegaly, splenomegaly or mass.     Tenderness: There is no abdominal tenderness.  Musculoskeletal:        General: Normal range of motion.     Cervical back: Neck supple.     Right lower leg: No edema.      Left lower leg: No edema.  Lymphadenopathy:     Cervical: No cervical adenopathy.  Skin:    General: Skin is warm and dry.     Coloration: Skin is not pale.  Neurological:     General: No focal deficit present.     Mental Status: She is alert and oriented to person, place, and time. Mental status is at baseline.  Psychiatric:        Mood and Affect: Mood normal.        Behavior: Behavior normal.        Thought Content: Thought content normal.        Judgment: Judgment normal.     Lab Results  Component Value Date   WBC 4.9 03/06/2020   HGB 12.6 03/06/2020   HCT 38.2 03/06/2020   PLT 248.0 03/06/2020   GLUCOSE 97 03/06/2020   CHOL 223 (H) 03/06/2020   TRIG 49.0 03/06/2020   HDL 72.90 03/06/2020   LDLCALC 140 (H) 03/06/2020   ALT 13 03/06/2020   AST 14 03/06/2020   NA 136 03/06/2020   K 3.9 03/06/2020   CL 103 03/06/2020   CREATININE 0.61 03/06/2020   BUN 12 03/06/2020   CO2 27 03/06/2020   TSH 2.03 03/06/2020   HGBA1C 5.7 03/06/2020    Assessment & Plan:   Jaquila was seen today for annual exam.  Diagnoses and all orders for this visit:  Encounter for screening mammogram for malignant neoplasm of breast -     MM Digital Screening; Future  Overweight (BMI 25.0-29.9)- In addition to lifestyle modifications I have recommended that she start a high-dose GLP-1 agonist. I gave her samples of SAXENDA and showed her the dosing titration schedule. -     CBC with Differential/Platelet; Future -     Basic metabolic panel; Future -     TSH; Future -     Hepatic function panel; Future -     Liraglutide -Weight Management (SAXENDA) 18 MG/3ML SOPN; Inject 0.5 mLs (3 mg total) into the skin daily. -     Hepatic function panel -     TSH -     Basic metabolic panel -     CBC with Differential/Platelet  Routine general medical examination at a health care facility- Exam completed, labs reviewed, vaccines reviewed and updated, screening for cervical and colon cancer is  up-to-date, will screen for breast cancer with a mammogram. -     Lipid panel; Future -     Lipid panel  Hyperglycemia- Her blood sugar is normal now. -     Basic metabolic panel; Future -     Hemoglobin A1c; Future -     Hepatic function panel; Future -     Hepatic function panel -     Hemoglobin A1c -     Basic metabolic panel   I am having Pansy L. Mancel Bale start on Saxenda. I am also having her maintain her valACYclovir, triamcinolone, ibuprofen, and Dexlansoprazole (DEXILANT PO).  Meds ordered this encounter  Medications  . Liraglutide -Weight Management (SAXENDA) 18 MG/3ML  SOPN    Sig: Inject 0.5 mLs (3 mg total) into the skin daily.    Dispense:  5 pen    Refill:  5     Follow-up: Return in about 6 months (around 09/06/2020).  Scarlette Calico, MD

## 2020-03-06 NOTE — Patient Instructions (Signed)
Health Maintenance, Female Adopting a healthy lifestyle and getting preventive care are important in promoting health and wellness. Ask your health care provider about:  The right schedule for you to have regular tests and exams.  Things you can do on your own to prevent diseases and keep yourself healthy. What should I know about diet, weight, and exercise? Eat a healthy diet   Eat a diet that includes plenty of vegetables, fruits, low-fat dairy products, and lean protein.  Do not eat a lot of foods that are high in solid fats, added sugars, or sodium. Maintain a healthy weight Body mass index (BMI) is used to identify weight problems. It estimates body fat based on height and weight. Your health care provider can help determine your BMI and help you achieve or maintain a healthy weight. Get regular exercise Get regular exercise. This is one of the most important things you can do for your health. Most adults should:  Exercise for at least 150 minutes each week. The exercise should increase your heart rate and make you sweat (moderate-intensity exercise).  Do strengthening exercises at least twice a week. This is in addition to the moderate-intensity exercise.  Spend less time sitting. Even light physical activity can be beneficial. Watch cholesterol and blood lipids Have your blood tested for lipids and cholesterol at 55 years of age, then have this test every 5 years. Have your cholesterol levels checked more often if:  Your lipid or cholesterol levels are high.  You are older than 55 years of age.  You are at high risk for heart disease. What should I know about cancer screening? Depending on your health history and family history, you may need to have cancer screening at various ages. This may include screening for:  Breast cancer.  Cervical cancer.  Colorectal cancer.  Skin cancer.  Lung cancer. What should I know about heart disease, diabetes, and high blood  pressure? Blood pressure and heart disease  High blood pressure causes heart disease and increases the risk of stroke. This is more likely to develop in people who have high blood pressure readings, are of African descent, or are overweight.  Have your blood pressure checked: ? Every 3-5 years if you are 18-39 years of age. ? Every year if you are 40 years old or older. Diabetes Have regular diabetes screenings. This checks your fasting blood sugar level. Have the screening done:  Once every three years after age 40 if you are at a normal weight and have a low risk for diabetes.  More often and at a younger age if you are overweight or have a high risk for diabetes. What should I know about preventing infection? Hepatitis B If you have a higher risk for hepatitis B, you should be screened for this virus. Talk with your health care provider to find out if you are at risk for hepatitis B infection. Hepatitis C Testing is recommended for:  Everyone born from 1945 through 1965.  Anyone with known risk factors for hepatitis C. Sexually transmitted infections (STIs)  Get screened for STIs, including gonorrhea and chlamydia, if: ? You are sexually active and are younger than 55 years of age. ? You are older than 55 years of age and your health care provider tells you that you are at risk for this type of infection. ? Your sexual activity has changed since you were last screened, and you are at increased risk for chlamydia or gonorrhea. Ask your health care provider if   you are at risk.  Ask your health care provider about whether you are at high risk for HIV. Your health care provider may recommend a prescription medicine to help prevent HIV infection. If you choose to take medicine to prevent HIV, you should first get tested for HIV. You should then be tested every 3 months for as long as you are taking the medicine. Pregnancy  If you are about to stop having your period (premenopausal) and  you may become pregnant, seek counseling before you get pregnant.  Take 400 to 800 micrograms (mcg) of folic acid every day if you become pregnant.  Ask for birth control (contraception) if you want to prevent pregnancy. Osteoporosis and menopause Osteoporosis is a disease in which the bones lose minerals and strength with aging. This can result in bone fractures. If you are 65 years old or older, or if you are at risk for osteoporosis and fractures, ask your health care provider if you should:  Be screened for bone loss.  Take a calcium or vitamin D supplement to lower your risk of fractures.  Be given hormone replacement therapy (HRT) to treat symptoms of menopause. Follow these instructions at home: Lifestyle  Do not use any products that contain nicotine or tobacco, such as cigarettes, e-cigarettes, and chewing tobacco. If you need help quitting, ask your health care provider.  Do not use street drugs.  Do not share needles.  Ask your health care provider for help if you need support or information about quitting drugs. Alcohol use  Do not drink alcohol if: ? Your health care provider tells you not to drink. ? You are pregnant, may be pregnant, or are planning to become pregnant.  If you drink alcohol: ? Limit how much you use to 0-1 drink a day. ? Limit intake if you are breastfeeding.  Be aware of how much alcohol is in your drink. In the [place] one drink equals one 12 oz bottle of beer (355 mL), one 5 oz glass of wine (148 mL), or one 1 oz glass of hard liquor (44 mL). General instructions  Schedule regular health, dental, and eye exams.  Stay current with your vaccines.  Tell your health care provider if: ? You often feel depressed. ? You have ever been abused or do not feel safe at home. Summary  Adopting a healthy lifestyle and getting preventive care are important in promoting health and wellness.  Follow your health care provider's instructions about healthy  diet, exercising, and getting tested or screened for diseases.  Follow your health care provider's instructions on monitoring your cholesterol and blood pressure. This information is not intended to replace advice given to you by your health care provider. Make sure you discuss any questions you have with your health care provider. Document Revised: 11/17/2018 Document Reviewed: 11/17/2018 Elsevier Patient Education  2020 Elsevier Inc.  

## 2020-03-07 ENCOUNTER — Other Ambulatory Visit: Payer: Self-pay | Admitting: Internal Medicine

## 2020-03-07 ENCOUNTER — Encounter: Payer: Self-pay | Admitting: Internal Medicine

## 2020-03-07 ENCOUNTER — Telehealth: Payer: Self-pay | Admitting: Internal Medicine

## 2020-03-07 NOTE — Telephone Encounter (Signed)
New message:   Pt is calling in regards to her lab results and would like to discuss some of her elevated results.

## 2020-03-07 NOTE — Telephone Encounter (Signed)
Pt contacted and we reviewed her test results and pt stated understanding.

## 2020-03-12 ENCOUNTER — Telehealth: Payer: Self-pay

## 2020-03-12 DIAGNOSIS — L659 Nonscarring hair loss, unspecified: Secondary | ICD-10-CM | POA: Diagnosis not present

## 2020-03-12 NOTE — Telephone Encounter (Signed)
New message   The patient calling   Prior authorization on  Liraglutide -Weight Management (SAXENDA) 18 MG/3ML SOPN n

## 2020-03-15 NOTE — Telephone Encounter (Signed)
Insurance is through herself. Company is [employer] ID: [id number] RXBIN: [id number] RXGRP: [id number]

## 2020-03-15 NOTE — Telephone Encounter (Signed)
I have attempted the prior auth twice.   It stated that no coverage was found for patient.   Can you call her and verify her information? Especially the prescription information including: Payer, ID, RXBIN, [id number] and RXGRP

## 2020-03-16 NOTE — Telephone Encounter (Signed)
SAXENDA 3 MG/0.5ML PEN INJCTR is not covered for this Member. For formulary alternatives, please view the Standard or Precision Formulary, whichever this Member is covered by, at [email or web address] or call us at [phone].

## 2020-03-16 NOTE — Telephone Encounter (Signed)
Called pharmacy and they sent a Key to cover my meds.   Key: [id number]

## 2020-03-19 NOTE — Telephone Encounter (Signed)
Patient informed that PA has been denied and excluded.

## 2020-03-26 ENCOUNTER — Telehealth: Payer: Self-pay

## 2020-03-26 NOTE — Telephone Encounter (Signed)
New message   The patient calling looking for options the cost is high $ 1,600 and insurance will not pay   Liraglutide -Weight Management (SAXENDA) 18 MG/3ML SOPN

## 2020-03-26 NOTE — Telephone Encounter (Signed)
Kristina [name] is excluded and her plan does not pay for obesity medication.

## 2020-04-04 ENCOUNTER — Encounter: Payer: Self-pay | Admitting: Internal Medicine

## 2020-04-23 DIAGNOSIS — L659 Nonscarring hair loss, unspecified: Secondary | ICD-10-CM | POA: Diagnosis not present

## 2020-05-08 ENCOUNTER — Telehealth: Payer: Self-pay

## 2020-05-08 ENCOUNTER — Other Ambulatory Visit: Payer: Self-pay | Admitting: Internal Medicine

## 2020-05-08 DIAGNOSIS — Z0000 Encounter for general adult medical examination without abnormal findings: Secondary | ICD-10-CM

## 2020-05-08 NOTE — Telephone Encounter (Signed)
Per MD need to have labs for tither check first. Pls call pt to inform her need labs first, and then MD will order what she needs. Pls cancel nurse visit that was made. He has already placed the order for labs. Just need to know what day she will come in for labs....Kristina [name]

## 2020-05-08 NOTE — Telephone Encounter (Signed)
New message    The patient is inquiring about TB/ MMR / Varicella / due to the credential.   Last office visit on  3.30.21  Appt is made on 6.3.21  Please advise on orders.

## 2020-05-08 NOTE — Telephone Encounter (Signed)
Pls advise if ok to give vaccinations.Marland KitchenJohny [name]

## 2020-05-08 NOTE — Telephone Encounter (Signed)
Noted../lmb 

## 2020-05-08 NOTE — Telephone Encounter (Signed)
F/u   Patient notified nurse visit to cancel out   Lab appt schedule on 6.3.21 @ 10am

## 2020-05-10 ENCOUNTER — Other Ambulatory Visit: Payer: BC Managed Care – PPO

## 2020-05-10 ENCOUNTER — Other Ambulatory Visit: Payer: Self-pay

## 2020-05-10 ENCOUNTER — Ambulatory Visit: Payer: BC Managed Care – PPO

## 2020-05-10 DIAGNOSIS — Z0000 Encounter for general adult medical examination without abnormal findings: Secondary | ICD-10-CM

## 2020-05-11 LAB — VARICELLA ZOSTER ANTIBODY, IGG: Varicella IgG: 1796 index

## 2020-05-11 LAB — RUBELLA SCREEN: Rubella: 9.42 Index

## 2020-05-12 LAB — QUANTIFERON-TB GOLD PLUS
Mitogen-NIL: >10 IU/mL
NIL: 0.02 IU/mL
QuantiFERON-TB Gold Plus: NEGATIVE
TB1-NIL: 0.01 IU/mL
TB2-NIL: 0.03 IU/mL

## 2020-05-12 LAB — MUMPS ANTIBODY, IGG: Mumps IgG: 121 AU/mL

## 2020-05-12 LAB — RUBEOLA ANTIBODY IGG: Rubeola IgG: 18.6 AU/mL

## 2020-05-13 ENCOUNTER — Encounter: Payer: Self-pay | Admitting: Internal Medicine

## 2020-05-14 DIAGNOSIS — L819 Disorder of pigmentation, unspecified: Secondary | ICD-10-CM | POA: Diagnosis not present

## 2020-05-18 ENCOUNTER — Telehealth: Payer: Self-pay

## 2020-05-18 NOTE — Telephone Encounter (Signed)
New message   The patient voiced had a recent lab once to know when should she get her MMR / TB / etc...  Please advise

## 2020-05-21 NOTE — Telephone Encounter (Signed)
Left detailed message for patient with result per PCP my chart message. Pt may come in and pick up results and/or drop off school form if that still needs to be completed.

## 2020-07-17 ENCOUNTER — Ambulatory Visit: Payer: BC Managed Care – PPO | Admitting: Internal Medicine

## 2020-07-17 ENCOUNTER — Encounter: Payer: Self-pay | Admitting: Internal Medicine

## 2020-07-17 VITALS — BP 120/74 | HR 76 | Ht 63.0 in | Wt 161.0 lb

## 2020-07-17 DIAGNOSIS — Z1211 Encounter for screening for malignant neoplasm of colon: Secondary | ICD-10-CM

## 2020-07-17 DIAGNOSIS — Z1371 Encounter for nonprocreative screening for genetic disease carrier status: Secondary | ICD-10-CM | POA: Diagnosis not present

## 2020-07-17 DIAGNOSIS — K222 Esophageal obstruction: Secondary | ICD-10-CM

## 2020-07-17 DIAGNOSIS — K219 Gastro-esophageal reflux disease without esophagitis: Secondary | ICD-10-CM

## 2020-07-17 NOTE — Progress Notes (Signed)
HISTORY OF PRESENT ILLNESS:  Kristina [name] is a pleasant 54 y.o. female with a history of GERD complicated by symptomatic peptic stricture for which she underwent upper endoscopy with esophageal dilation in 2003.  Patient has not been followed regularly since.  She presents today regarding recent genetic testing and inquires regarding the appropriateness of screening colonoscopy.  We do not have the results of that testing available at this time.  She mentioned abnormal testing related to the APC gene.  She also reports a grandmother who underwent resection of her colon for unclear reasons.  This gives her some concern.  She has however had complete colonoscopy with Dr. Laurence Spates June 2017.  The exam was unremarkable with no neoplasia identified.  At this time, patient's GI review of systems is remarkable for occasional indigestion heartburn for which she takes Dexilant on demand.  She denies any significant recurrent dysphagia.  She does report occasional issues related to hemorrhoids.  No known family members with known colon cancer.  She has completed her Covid vaccinations  REVIEW OF SYSTEMS:  All non-GI ROS negative unless otherwise stated in the HPI except for sinus and allergies  Past Medical History:  Diagnosis Date  . Anemia    history  . Anxiety    no meds  . Dysrhythmia    HX -pt states she wore a holter in distant past for pvc-told d/t caffeine intake  . GERD (gastroesophageal reflux disease)    occ - otc prn  . Headache(784.0)    otc med prn  . HSV infection   . Ovarian cyst, right   . PONV (postoperative nausea and vomiting)     Past Surgical History:  Procedure Laterality Date  . ABLATION    . DIAGNOSTIC LAPAROSCOPY    . DILATATION & CURRETTAGE/HYSTEROSCOPY WITH RESECTOCOPE N/A 01/28/2013   Procedure: DILATATION & CURETTAGE/HYSTEROSCOPY WITH RESECTOCOPE resection of submucosal fibroid;  Surgeon: Marvene Staff, MD;  Location: [facility];  Service: Gynecology;   Laterality: N/A;   diagnostic hysteroscopy with resection of submucosal fibroid  . LEFT OOPHORECTOMY    . mym    . MYOMECTOMY    . ROBOTIC ASSISTED TOTAL HYSTERECTOMY N/A 03/10/2014   Procedure: ROBOTIC ASSISTED TOTAL HYSTERECTOMY;  Surgeon: Marvene Staff, MD;  Location: [facility];  Service: Gynecology;  Laterality: N/A;  . TUBAL LIGATION     on right side only,   . UNILATERAL SALPINGECTOMY    . UNILATERAL SALPINGECTOMY Right 03/10/2014   Procedure: UNILATERAL SALPINGECTOMY;  Surgeon: Marvene Staff, MD;  Location: [facility];  Service: Gynecology;  Laterality: Right;    Social History Kristina [name]  reports that she quit smoking about 27 years ago. Her smoking use included cigarettes. She has a 2.50 pack-year smoking history. She has never used smokeless tobacco. She reports current alcohol use of about 14.0 standard drinks of alcohol per week. She reports that she does not use drugs.  family history includes Diabetes in her father; Hypertension in her mother; Macular degeneration in her mother.  Allergies  Allergen Reactions  . Fluconazole Other (See Comments)    Other  . Metronidazole Other (See Comments)    headache  . Percocet [Oxycodone-Acetaminophen] Nausea And Vomiting    "out of her mind"  . Tetracyclines & Related Nausea Only       PHYSICAL EXAMINATION: Vital signs: BP 120/74   Pulse 76   Ht 5\' 3"  (1.6 m)   Wt 161 lb (73 kg)   LMP 09/29/2013  BMI 28.52 kg/m   Constitutional: generally well-appearing, no acute distress Psychiatric: alert and oriented x3, cooperative Eyes: extraocular movements intact, anicteric, conjunctiva pink Mouth: oral pharynx moist, no lesions Neck: supple no lymphadenopathy Cardiovascular: heart regular rate and rhythm, no murmur Lungs: clear to auscultation bilaterally Abdomen: soft, nontender, nondistended, no obvious ascites, no peritoneal signs, normal bowel sounds, no organomegaly Rectal: Omitted Extremities: no clubbing,  cyanosis, or lower extremity edema bilaterally Skin: no lesions on visible extremities Neuro: No focal deficits.  Cranial nerves intact  ASSESSMENT:  1.  Abnormal genetic testing.  Unspecified 2.  Normal colonoscopy June 2017 (Dr. Oletta Lamas) 3.  GERD with a history of peptic stricture.  Currently using PPI therapy on demand.  No significant dysphagia   PLAN:  1.  Patient will obtain a copy of her genetic testing and bring this by for my review.  We can decide thereafter when to be the appropriate time for repeat screening colonoscopy. 2.  Reflux precautions 3.  PPI on demand for GERD 4.  Interval GI follow-up as needed

## 2020-07-17 NOTE — Patient Instructions (Signed)
We will get back to you after Dr. Henrene Pastor has reviewed your genetic testing information

## 2020-09-05 ENCOUNTER — Encounter: Payer: Self-pay | Admitting: Internal Medicine

## 2020-09-05 ENCOUNTER — Ambulatory Visit: Payer: BC Managed Care – PPO | Admitting: Internal Medicine

## 2020-09-05 ENCOUNTER — Other Ambulatory Visit: Payer: Self-pay

## 2020-09-05 VITALS — BP 122/78 | HR 74 | Temp 97.8°F | Ht 63.0 in | Wt 159.0 lb

## 2020-09-05 DIAGNOSIS — I889 Nonspecific lymphadenitis, unspecified: Secondary | ICD-10-CM | POA: Diagnosis not present

## 2020-09-05 DIAGNOSIS — E785 Hyperlipidemia, unspecified: Secondary | ICD-10-CM

## 2020-09-05 DIAGNOSIS — Z23 Encounter for immunization: Secondary | ICD-10-CM

## 2020-09-05 MED ORDER — CEFDINIR 300 MG PO CAPS: 300.0000 mg | ORAL_CAPSULE | Freq: Two times a day (BID) | ORAL | 0 refills | Status: DC

## 2020-09-05 MED ORDER — CEFDINIR 300 MG PO CAPS: 300.0000 mg | ORAL_CAPSULE | Freq: Two times a day (BID) | ORAL | 0 refills | Status: AC

## 2020-09-05 NOTE — Progress Notes (Addendum)
Subjective:  Patient ID: Kristina [name], female    DOB: [birth year]  Age: 54 y.o. MRN: [id number]  CC: Lymphadenopathy  This visit occurred during the SARS-CoV-2 public health emergency.  Safety protocols were in place, including screening questions prior to the visit, additional usage of staff PPE, and extensive cleaning of exam room while observing appropriate contact time as indicated for disinfecting solutions.    HPI [name] presents for f/up - She complains of a 3-day history of left-sided neck pain that radiates towards her left ear and ST.  She denies rash, loss of hearing, tinnitus, vertigo, fever, or chills.  She continues to be concerned about her cholesterol level and wants to check it again.  Outpatient Medications Prior to Visit  Medication Sig Dispense Refill  . Dexlansoprazole (DEXILANT PO) Take by mouth.    . triamcinolone (NASACORT AQ) 55 MCG/ACT AERO nasal inhaler Place 2 sprays into the nose daily as needed.     . valACYclovir (VALTREX) 500 MG tablet Take 1,000 mg by mouth 2 (two) times daily as needed.    Marland Kitchen ibuprofen (ADVIL,MOTRIN) 800 MG tablet Take 1 tablet (800 mg total) by mouth every 8 (eight) hours as needed (mild pain). 30 tablet 0   No facility-administered medications prior to visit.    ROS Review of Systems  Constitutional: Negative for chills, diaphoresis, fatigue and fever.  HENT: Positive for ear pain and sore throat. Negative for facial swelling, trouble swallowing and voice change.   Eyes: Negative.   Respiratory: Negative for cough and shortness of breath.   Cardiovascular: Negative for chest pain, palpitations and leg swelling.  Gastrointestinal: Negative for abdominal pain, constipation, diarrhea, nausea and vomiting.  Endocrine: Negative.   Genitourinary: Negative.   Musculoskeletal: Negative.   Skin: Negative.  Negative for color change, pallor and rash.  Neurological: Negative.  Negative for headaches.  Hematological: Positive  for adenopathy. Does not bruise/bleed easily.  Psychiatric/Behavioral: Negative.     Objective:  BP 122/78   Pulse 74   Temp 97.8 F (36.6 C) (Oral)   Ht 5\' 3"  (1.6 m)   Wt 159 lb (72.1 kg)   LMP 09/29/2013   SpO2 98%   BMI 28.17 kg/m   BP Readings from Last 3 Encounters:  09/05/20 122/78  07/17/20 120/74  03/06/20 120/74    Wt Readings from Last 3 Encounters:  09/05/20 159 lb (72.1 kg)  07/17/20 161 lb (73 kg)  03/06/20 162 lb (73.5 kg)    Physical Exam Vitals reviewed.  Constitutional:      Appearance: Normal appearance.  HENT:     Right Ear: Hearing, tympanic membrane, ear canal and external ear normal.     Left Ear: Hearing, tympanic membrane, ear canal and external ear normal.  No middle ear effusion. Tympanic membrane is not erythematous.     Nose: Nose normal.     Mouth/Throat:     Mouth: Mucous membranes are moist.     Pharynx: No oropharyngeal exudate or posterior oropharyngeal erythema.  Eyes:     General: No scleral icterus.    Conjunctiva/sclera: Conjunctivae normal.  Neck:     Comments: There is an asymmetrically enlarged left anterior cervical lymph node.  It is mildly tender.  Freely mobile.  Nonfluctuant.  The overlying skin is normal. Cardiovascular:     Rate and Rhythm: Normal rate and regular rhythm.     Heart sounds: No murmur heard.   Pulmonary:     Effort: Pulmonary effort is normal.  Breath sounds: No stridor. No wheezing, rhonchi or rales.  Abdominal:     General: Abdomen is flat. Bowel sounds are normal. There is no distension.     Palpations: Abdomen is soft. There is no hepatomegaly, splenomegaly or mass.  Musculoskeletal:        General: Normal range of motion.     Cervical back: Normal range of motion and neck supple. No tenderness.     Right lower leg: No edema.     Left lower leg: No edema.  Lymphadenopathy:     Head:     Right side of head: No submental, submandibular, tonsillar, preauricular, posterior auricular or  occipital adenopathy.     Left side of head: No submental, submandibular, tonsillar, preauricular, posterior auricular or occipital adenopathy.     Cervical: Cervical adenopathy present.     Right cervical: No superficial, deep or posterior cervical adenopathy.    Left cervical: Superficial cervical adenopathy present. No deep or posterior cervical adenopathy.     Upper Body:     Right upper body: No supraclavicular or axillary adenopathy.     Left upper body: No supraclavicular or axillary adenopathy.  Skin:    General: Skin is warm and dry.     Findings: No rash.  Neurological:     Mental Status: She is alert.     Lab Results  Component Value Date   WBC 5.9 09/05/2020   HGB 13.4 09/05/2020   HCT 42.5 09/05/2020   PLT 282 09/05/2020   GLUCOSE 97 03/06/2020   CHOL 191 09/05/2020   TRIG 75 09/05/2020   HDL 56 09/05/2020   LDLCALC 118 (H) 09/05/2020   ALT 13 03/06/2020   AST 14 03/06/2020   NA 136 03/06/2020   K 3.9 03/06/2020   CL 103 03/06/2020   CREATININE 0.61 03/06/2020   BUN 12 03/06/2020   CO2 27 03/06/2020   TSH 2.03 03/06/2020   HGBA1C 5.7 03/06/2020    DG Chest 2 View  Result Date: 03/08/2018 CLINICAL DATA:  Chest pain EXAM: CHEST - 2 VIEW COMPARISON:  07/14/2014 FINDINGS: Lungs are clear.  No pleural effusion or pneumothorax. The heart is normal in size. Visualized osseous structures are within normal limits. IMPRESSION: Normal chest radiographs. Electronically Signed   By: Julian Hy M.D.   On: 03/08/2018 20:42    Assessment & Plan:   Aryiah was seen today for lymphadenopathy.  Diagnoses and all orders for this visit:  Cervical lymphadenitis- She has cervical lymphadenitis with a normal white cell count and a normal sed rate.  This is likely bacterial.  Will try a 10-day course of broad-spectrum cephalosporin antibiotic.  If this does not resolve soon then will consider doing a CT scan to screen for a malignant process. -     Discontinue: cefdinir  (OMNICEF) 300 MG capsule; Take 1 capsule (300 mg total) by mouth 2 (two) times daily for 10 days. -     CBC with Differential/Platelet; Future -     Sedimentation rate; Future -     cefdinir (OMNICEF) 300 MG capsule; Take 1 capsule (300 mg total) by mouth 2 (two) times daily for 10 days. -     Sedimentation rate -     CBC with Differential/Platelet  Hyperlipidemia LDL goal <130- Her ASCVD risk score is low so I do not recommend a statin for CV risk reduction. -     Lipid panel; Future -     Lipid panel  Other orders -  Flu Vaccine QUAD 6+ mos PF IM (Fluarix Quad PF)   I have discontinued Cherron L. Mcquiston's ibuprofen. I am also having her maintain her valACYclovir, triamcinolone, Dexlansoprazole (DEXILANT PO), and cefdinir.  Meds ordered this encounter  Medications  . DISCONTD: cefdinir (OMNICEF) 300 MG capsule    Sig: Take 1 capsule (300 mg total) by mouth 2 (two) times daily for 10 days.    Dispense:  20 capsule    Refill:  0  . cefdinir (OMNICEF) 300 MG capsule    Sig: Take 1 capsule (300 mg total) by mouth 2 (two) times daily for 10 days.    Dispense:  20 capsule    Refill:  0     Follow-up: Return in about 3 weeks (around 09/26/2020).  Scarlette Calico, MD

## 2020-09-05 NOTE — Patient Instructions (Signed)
Lymphangitis, Adult  Lymphangitis is inflammation of one or more lymph vessels. This condition is usually caused by an infection with bacteria. The lymphatic system is part of the body's defense system (immune system). It is a network of vessels, glands, and organs that carry fluid (lymph) and other substances around the body. Lymph vessels drain into glands called lymph nodes. These nodes remove bacteria, viruses, and waste products from lymph to keep them from spreading through the body. Lymphangitis causes red streaks, swelling, and skin soreness in the area of the affected lymph vessels. Starting treatment right away is important because this condition can quickly get worse and lead to serious illness. It can spread quickly through your lymph system and into your blood (bacteremia). What are the causes? This condition is usually caused by a bacterial infection of the skin. The bacteria may enter the body through an injury to the skin, such as a cut, scratch, surgical incision, or insect bite. Lymphangitis usually results from an infection with streptococcus or staphylococcus bacteria, but it may also be caused by other infections. What increases the risk? The following factors may make you more likely to develop this condition:  Being female. Men are more likely to get lymphangitis caused by cellulitis.  Having a decreased ability to fight infection or a weakened immune system.  Having diabetes.  Taking drugs that suppress the immune system.  Having chickenpox.  Being weak from another illness. What are the signs or symptoms? The most common symptom of lymphangitis is a wound or skin infection that develops red streaks in the skin. These are the infected lymph vessels. The red streaks will extend toward the lymph nodes that drain the vessels.  Other symptoms may include:  Warmth and tenderness over the streaks.  Throbbing pain.  Swollen and tender lymph nodes. ? For arm infections,  these will be under the arm. ? For leg infections, these will be in the groin area.  Fever.  Chills.  Headache.  Appetite loss.  Muscle aches.  Fast pulse. How is this diagnosed? This condition may be diagnosed based on your symptoms and a physical exam. You may also have tests, such as:  Blood tests to check for an increase in white blood cells.  Blood cultures to look for bacteremia.  Culture and sensitivity testing. This is a test to find out what type of bacteria will grow from a sample of pus swabbed from the wound or skin infection. The results help determine which antibiotic medicines will kill the bacteria.  X-rays. These may be needed if you have a red or swollen joint. In this case, you may also be referred to a bone specialist. How is this treated? Treatment for this condition may include:  Antibiotics. ? You may be started on an antibiotic that is known to kill both streptococcus and staphylococcus bacteria. ? Your antibiotics may need to be switched if tests show that your condition is caused by another type of bacteria. ? If your infection is very bad or has spread to another area of your body, you may need to get antibiotics given directly into a vein through an IV at the hospital.  Pain medicine.  Incision and drainage. This is a procedure that may be done at the hospital if pus needs to be drained from your wound. Follow these instructions at home:  Take over-the-counter and prescription medicines only as told by your health care provider.  Take your antibiotic medicine as told by your health care provider.  Do not stop taking the antibiotic even if you start to feel better.  Rest at home until your health care provider says that you can return to your normal activities.  Drink enough fluid to keep your urine pale yellow.  Follow instructions from your health care provider about how to take care of any wound.  Raise (elevate) the affected area above the  level of your heart while you are sitting or lying down.  Keep all follow-up visits as told by your health care provider. This is important. Contact a health care provider if:  You have chills or a fever.  Your symptoms do not go away or they get worse with treatment.  Your symptoms come back after treatment. Get help right away if you have:  A headache or a stiff neck.  Chest pain.  Trouble breathing. Summary  Lymphangitis is inflammation of one or more lymph vessels. It is usually caused by a bacterial infection.  Take your antibiotic medicine as told by your health care provider. Do not stop taking the antibiotic even if you start to feel better.  Rest and drink plenty of fluids.  Keep all follow-up visits as told by your health care provider. This is important. This information is not intended to replace advice given to you by your health care provider. Make sure you discuss any questions you have with your health care provider. Document Revised: 08/30/2018 Document Reviewed: 08/30/2018 Elsevier Patient Education  2020 [employer].

## 2020-09-06 LAB — CBC WITH DIFFERENTIAL/PLATELET
Absolute Monocytes: 448 cells/uL (ref 200–950)
Basophils Absolute: 47 cells/uL (ref 0–200)
Basophils Relative: 0.8 %
Eosinophils Absolute: 100 cells/uL (ref 15–500)
Eosinophils Relative: 1.7 %
HCT: 42.5 % (ref 35.0–45.0)
Hemoglobin: 13.4 g/dL (ref 11.7–15.5)
Lymphs Abs: 2685 cells/uL (ref 850–3900)
MCH: 28.6 pg (ref 27.0–33.0)
MCHC: 31.5 g/dL — ABNORMAL LOW (ref 32.0–36.0)
MCV: 90.8 fL (ref 80.0–100.0)
MPV: 11.3 fL (ref 7.5–12.5)
Monocytes Relative: 7.6 %
Neutro Abs: 2620 cells/uL (ref 1500–7800)
Neutrophils Relative %: 44.4 %
Platelets: 282 10*3/uL (ref 140–400)
RBC: 4.68 10*6/uL (ref 3.80–5.10)
RDW: 14.3 % (ref 11.0–15.0)
Total Lymphocyte: 45.5 %
WBC: 5.9 10*3/uL (ref 3.8–10.8)

## 2020-09-06 LAB — LIPID PANEL
Cholesterol: 191 mg/dL (ref <200)
HDL: 56 mg/dL (ref >=50)
LDL Cholesterol (Calc): 118 mg/dL (calc) — ABNORMAL HIGH
Non-HDL Cholesterol (Calc): 135 mg/dL (calc) — ABNORMAL HIGH (ref <130)
Total CHOL/HDL Ratio: 3.4 (calc) (ref <5.0)
Triglycerides: 75 mg/dL (ref <150)

## 2020-09-06 LAB — SEDIMENTATION RATE: Sed Rate: 22 mm/h (ref 0–30)

## 2020-09-19 ENCOUNTER — Telehealth: Payer: Self-pay | Admitting: Emergency Medicine

## 2020-09-19 ENCOUNTER — Other Ambulatory Visit: Payer: Self-pay | Admitting: Internal Medicine

## 2020-09-19 DIAGNOSIS — E2839 Other primary ovarian failure: Secondary | ICD-10-CM

## 2020-09-19 NOTE — Telephone Encounter (Signed)
Pt called and wants to know if she can get an order put in to get a bone density. Please advise thanks.

## 2020-09-21 ENCOUNTER — Telehealth: Payer: Self-pay | Admitting: Internal Medicine

## 2020-09-21 NOTE — Telephone Encounter (Signed)
Patient calling to request a document stating whether her chick pox vaccine are within a certain range of normal or abnormal for [facility].  Patient contact # [phone]

## 2020-09-22 ENCOUNTER — Other Ambulatory Visit: Payer: Self-pay | Admitting: Internal Medicine

## 2020-09-22 ENCOUNTER — Encounter: Payer: Self-pay | Admitting: Internal Medicine

## 2020-09-24 DIAGNOSIS — Z1231 Encounter for screening mammogram for malignant neoplasm of breast: Secondary | ICD-10-CM | POA: Diagnosis not present

## 2020-09-24 LAB — HM MAMMOGRAPHY

## 2020-09-24 NOTE — Telephone Encounter (Signed)
Pt has been informed.  Letter at front desk.

## 2020-10-30 ENCOUNTER — Ambulatory Visit (INDEPENDENT_AMBULATORY_CARE_PROVIDER_SITE_OTHER)
Admission: RE | Admit: 2020-10-30 | Discharge: 2020-10-30 | Disposition: A | Discharge status: Home / Self Care | Payer: BC Managed Care – PPO | Source: Ambulatory Visit | Attending: Internal Medicine | Admitting: Internal Medicine

## 2020-10-30 ENCOUNTER — Other Ambulatory Visit: Payer: Self-pay

## 2020-10-30 DIAGNOSIS — E2839 Other primary ovarian failure: Secondary | ICD-10-CM | POA: Diagnosis not present

## 2020-11-03 DIAGNOSIS — E2839 Other primary ovarian failure: Secondary | ICD-10-CM | POA: Diagnosis not present

## 2020-11-04 LAB — HM DEXA SCAN: HM Dexa Scan: -1.2

## 2020-11-06 ENCOUNTER — Other Ambulatory Visit: Payer: Self-pay | Admitting: Internal Medicine

## 2020-11-06 ENCOUNTER — Telehealth: Payer: Self-pay | Admitting: Internal Medicine

## 2020-11-06 DIAGNOSIS — M810 Age-related osteoporosis without current pathological fracture: Secondary | ICD-10-CM | POA: Insufficient documentation

## 2020-11-06 DIAGNOSIS — M8580 Other specified disorders of bone density and structure, unspecified site: Secondary | ICD-10-CM | POA: Insufficient documentation

## 2020-11-06 MED ORDER — CHOLECALCIFEROL 50 MCG (2000 UT) PO TABS: 1.0000 | ORAL_TABLET | Freq: Every day | ORAL | 3 refills | Status: DC

## 2020-11-06 MED ORDER — RISEDRONATE SODIUM 150 MG PO TABS: 150.0000 mg | ORAL_TABLET | ORAL | 1 refills | Status: DC

## 2020-11-06 NOTE — Telephone Encounter (Signed)
Patient saw her results for her bone density scan and saw Dr. Ronnald Ramp said she had osteopenia and she wanted to know what her treatment options for this was and she wanted to begin treatment. [phone]

## 2020-11-07 NOTE — Telephone Encounter (Signed)
Pt stated that she has made an OV for Monday to come and discuss the medication sent in and some of her concerns.

## 2020-11-12 ENCOUNTER — Encounter: Payer: Self-pay | Admitting: Internal Medicine

## 2020-11-12 ENCOUNTER — Ambulatory Visit: Payer: BC Managed Care – PPO | Admitting: Internal Medicine

## 2020-11-12 ENCOUNTER — Other Ambulatory Visit: Payer: Self-pay

## 2020-11-12 VITALS — BP 124/78 | HR 78 | Temp 98.2°F | Resp 16 | Ht 63.0 in | Wt 156.0 lb

## 2020-11-12 DIAGNOSIS — M810 Age-related osteoporosis without current pathological fracture: Secondary | ICD-10-CM

## 2020-11-12 MED ORDER — FOSTEUM PLUS PO CAPS: 1.0000 | ORAL_CAPSULE | Freq: Two times a day (BID) | ORAL | 11 refills | Status: DC

## 2020-11-12 NOTE — Patient Instructions (Signed)
Osteoporosis  Osteoporosis is thinning and loss of density in your bones. Osteoporosis makes bones more brittle and fragile and more likely to break (fracture). Over time, osteoporosis can cause your bones to become so weak that they fracture after a minor fall. Bones in the hip, wrist, and spine are most likely to fracture due to osteoporosis. What are the causes? The exact cause of this condition is not known. What increases the risk? You may be at greater risk for osteoporosis if you:  Have a family history of the condition.  Have poor nutrition.  Use steroid medicines, such as prednisone.  Are female.  Are age 55 or older.  Smoke or have a history of smoking.  Are not physically active (are sedentary).  Are white (Caucasian) or of Asian descent.  Have a small body frame.  Take certain medicines, such as antiseizure medicines. What are the signs or symptoms? A fracture might be the first sign of osteoporosis, especially if the fracture results from a fall or injury that usually would not cause a bone to break. Other signs and symptoms include:  Pain in the neck or low back.  Stooped posture.  Loss of height. How is this diagnosed? This condition may be diagnosed based on:  Your medical history.  A physical exam.  A bone mineral density test, also called a DXA or DEXA test (dual-energy X-ray absorptiometry test). This test uses X-rays to measure the amount of minerals in your bones. How is this treated? The goal of treatment is to strengthen your bones and lower your risk for a fracture. Treatment may involve:  Making lifestyle changes, such as: ? Including foods with more calcium and vitamin D in your diet. ? Doing weight-bearing and muscle-strengthening exercises. ? Stopping tobacco use. ? Limiting alcohol intake.  Taking medicine to slow the process of bone loss or to increase bone density.  Taking daily supplements of calcium and vitamin D.  Taking  hormone replacement medicines, such as estrogen for women and testosterone for men.  Monitoring your levels of calcium and vitamin D. Follow these instructions at home:  Activity  Exercise as told by your health care provider. Ask your health care provider what exercises and activities are safe for you. You should do: ? Exercises that make you work against gravity (weight-bearing exercises), such as tai chi, yoga, or walking. ? Exercises to strengthen muscles, such as lifting weights. Lifestyle  Limit alcohol intake to no more than 1 drink a day for nonpregnant women and 2 drinks a day for men. One drink equals 12 oz of beer, 5 oz of wine, or 1 oz of hard liquor.  Do not use any products that contain nicotine or tobacco, such as cigarettes and e-cigarettes. If you need help quitting, ask your health care provider. Preventing falls  Use devices to help you move around (mobility aids) as needed, such as canes, walkers, scooters, or crutches.  Keep rooms well-lit and clutter-free.  Remove tripping hazards from walkways, including cords and throw rugs.  Install grab bars in bathrooms and safety rails on stairs.  Use rubber mats in the bathroom and other areas that are often wet or slippery.  Wear closed-toe shoes that fit well and support your feet. Wear shoes that have rubber soles or low heels.  Review your medicines with your health care provider. Some medicines can cause dizziness or changes in blood pressure, which can increase your risk of falling. General instructions  Include calcium and vitamin D in  your diet. Calcium is important for bone health, and vitamin D helps your body to absorb calcium. Good sources of calcium and vitamin D include: ? Certain fatty fish, such as salmon and tuna. ? Products that have calcium and vitamin D added to them (fortified products), such as fortified cereals. ? Egg yolks. ? Cheese. ? Liver.  Take over-the-counter and prescription medicines  only as told by your health care provider.  Keep all follow-up visits as told by your health care provider. This is important. Contact a health care provider if:  You have never been screened for osteoporosis and you are: ? A woman who is age 55 or older. ? A man who is age 20 or older. Get help right away if:  You fall or injure yourself. Summary  Osteoporosis is thinning and loss of density in your bones. This makes bones more brittle and fragile and more likely to break (fracture),even with minor falls.  The goal of treatment is to strengthen your bones and reduce your risk for a fracture.  Include calcium and vitamin D in your diet. Calcium is important for bone health, and vitamin D helps your body to absorb calcium.  Talk with your health care provider about screening for osteoporosis if you are a woman who is age 55 or older, or a man who is age 15 or older. This information is not intended to replace advice given to you by your health care provider. Make sure you discuss any questions you have with your health care provider. Document Revised: 11/06/2017 Document Reviewed: 09/18/2017 Elsevier Patient Education  2020 [employer].

## 2020-11-12 NOTE — Progress Notes (Signed)
Subjective:  Patient ID: Kristina [name], female    DOB: 22-Jan-1966  Age: 54 y.o. MRN: [id number]  CC: Osteoporosis  This visit occurred during the SARS-CoV-2 public health emergency.  Safety protocols were in place, including screening questions prior to the visit, additional usage of staff PPE, and extensive cleaning of exam room while observing appropriate contact time as indicated for disinfecting solutions.    HPI RENATE DANH presents for f/up - She has decided that she does not want to take a bisphosphonate for the treatment of OP.  Outpatient Medications Prior to Visit  Medication Sig Dispense Refill  . Cholecalciferol 50 MCG (2000 UT) TABS Take 1 tablet (2,000 Units total) by mouth daily. 90 tablet 3  . Dexlansoprazole (DEXILANT PO) Take by mouth.    . triamcinolone (NASACORT AQ) 55 MCG/ACT AERO nasal inhaler Place 2 sprays into the nose daily as needed.     . valACYclovir (VALTREX) 500 MG tablet Take 1,000 mg by mouth 2 (two) times daily as needed.    . risedronate (ACTONEL) 150 MG tablet Take 1 tablet (150 mg total) by mouth every 30 (thirty) days. with water on empty stomach, nothing by mouth or lie down for next 30 minutes. 12 tablet 1   No facility-administered medications prior to visit.    ROS Review of Systems  Constitutional: Negative.  Negative for diaphoresis, fatigue and unexpected weight change.  HENT: Negative.   Eyes: Negative for visual disturbance.  Respiratory: Negative for cough, chest tightness, shortness of breath and wheezing.   Cardiovascular: Negative for chest pain, palpitations and leg swelling.  Gastrointestinal: Negative for abdominal pain, constipation, diarrhea, nausea and vomiting.  Endocrine: Negative.   Genitourinary: Negative.  Negative for difficulty urinating.  Musculoskeletal: Negative for arthralgias and joint swelling.  Skin: Negative for color change.  Neurological: Negative.  Negative for dizziness, weakness, light-headedness  and headaches.  Hematological: Negative for adenopathy. Does not bruise/bleed easily.  Psychiatric/Behavioral: Negative.     Objective:  BP 124/78   Pulse 78   Temp 98.2 F (36.8 C) (Oral)   Resp 16   Ht 5\' 3"  (1.6 m)   Wt 156 lb (70.8 kg)   LMP 09/29/2013   SpO2 97%   BMI 27.63 kg/m   BP Readings from Last 3 Encounters:  11/12/20 124/78  09/05/20 122/78  07/17/20 120/74    Wt Readings from Last 3 Encounters:  11/12/20 156 lb (70.8 kg)  09/05/20 159 lb (72.1 kg)  07/17/20 161 lb (73 kg)    Physical Exam Vitals reviewed.  HENT:     Nose: Nose normal.     Mouth/Throat:     Mouth: Mucous membranes are moist.  Eyes:     General: No scleral icterus.    Conjunctiva/sclera: Conjunctivae normal.  Cardiovascular:     Rate and Rhythm: Normal rate and regular rhythm.     Heart sounds: No murmur heard.   Pulmonary:     Effort: Pulmonary effort is normal.     Breath sounds: No stridor. No wheezing, rhonchi or rales.  Abdominal:     General: Abdomen is flat. Bowel sounds are normal. There is no distension.     Palpations: Abdomen is soft. There is no hepatomegaly, splenomegaly or mass.     Tenderness: There is no abdominal tenderness.  Musculoskeletal:        General: Normal range of motion.     Cervical back: Neck supple.     Right lower leg: No edema.  Left lower leg: No edema.  Lymphadenopathy:     Cervical: No cervical adenopathy.  Skin:    General: Skin is warm and dry.  Neurological:     General: No focal deficit present.     Mental Status: She is alert.     Lab Results  Component Value Date   WBC 5.9 09/05/2020   HGB 13.4 09/05/2020   HCT 42.5 09/05/2020   PLT 282 09/05/2020   GLUCOSE 97 03/06/2020   CHOL 191 09/05/2020   TRIG 75 09/05/2020   HDL 56 09/05/2020   LDLCALC 118 (H) 09/05/2020   ALT 13 03/06/2020   AST 14 03/06/2020   NA 136 03/06/2020   K 3.9 03/06/2020   CL 103 03/06/2020   CREATININE 0.61 03/06/2020   BUN 12 03/06/2020    CO2 27 03/06/2020   TSH 2.03 03/06/2020   HGBA1C 5.7 03/06/2020    DG Bone Density  Result Date: 11/03/2020 Date of study: 10/30/20 Exam: DUAL X-RAY ABSORPTIOMETRY (DXA) FOR BONE MINERAL DENSITY (BMD) Instrument: [employer] Provider: PCP Indication: follow up for low BMD Comparison: none (please note that it is not possible to compare data from different instruments) Clinical data: Pt is a 54 y.o. female without previous history of fracture. On calcium and vitamin D. Results:  Lumbar spine L1-L4 Femoral neck (FN) 33% distal radius T-score -0.1 RFN: -0.8 LFN: -1.2 n/a Change in BMD from previous DXA test (%) n/a n/a n/a (*) statistically significant Assessment: the BMD is low according to the [facility] classification for osteoporosis (see below). Fracture risk: moderate FRAX score: 10 year major osteoporotic risk: 2.7%. 10 year hip fracture risk: 0.2%. The thresholds for treatment are 20% and 3%, respectively. Comments: the technical quality of the study is good. Evaluation for secondary causes should be considered if clinically indicated. Recommend optimizing calcium (1200 mg/day) and vitamin D (800 IU/day) intake. Followup: Repeat BMD is appropriate after 2 years or after 1-2 years if starting treatment. WHO criteria for diagnosis of osteoporosis in postmenopausal women and in men 73 y/o or older: - normal: T-score -1.0 to + 1.0 - osteopenia/low bone density: T-score between -2.5 and -1.0 - osteoporosis: T-score below -2.5 - severe osteoporosis: T-score below -2.5 with history of fragility fracture Note: although not part of the WHO classification, the presence of a fragility fracture, regardless of the T-score, should be considered diagnostic of osteoporosis, provided other causes for the fracture have been excluded. Treatment: The National Osteoporosis Foundation recommends that treatment be considered in postmenopausal women and men age 50 or older with: 1. Hip or vertebral (clinical or  morphometric) fracture 2. T-score of - 2.5 or lower at the spine or hip 3. 10-year fracture probability by FRAX of at least 20% for a major osteoporotic fracture and 3% for a hip fracture Loura Pardon MD    Assessment & Plan:   Makalynn was seen today for osteoporosis.  Diagnoses and all orders for this visit:  Age-related osteoporosis without current pathological fracture- She is not willing to take a bisphos. Will treat with genistein and the other elements in Fosteum Plus. She will also do more weight-bearing activity. -     Dietary Management Product (FOSTEUM PLUS) CAPS; Take 1 capsule by mouth 2 (two) times daily.   I have discontinued Lilac L. Uhrich's risedronate. I am also having her start on Fosteum Plus. Additionally, I am having her maintain her valACYclovir, triamcinolone, Dexlansoprazole (DEXILANT PO), and Cholecalciferol.  Meds ordered this encounter  Medications  .  Dietary Management Product (FOSTEUM PLUS) CAPS    Sig: Take 1 capsule by mouth 2 (two) times daily.    Dispense:  60 capsule    Refill:  11     Follow-up: Return in about 6 months (around 05/13/2021).  Scarlette Calico, MD

## 2020-12-17 DIAGNOSIS — Z1152 Encounter for screening for COVID-19: Secondary | ICD-10-CM | POA: Diagnosis not present

## 2020-12-18 DIAGNOSIS — M8580 Other specified disorders of bone density and structure, unspecified site: Secondary | ICD-10-CM | POA: Diagnosis not present

## 2020-12-18 DIAGNOSIS — Z90710 Acquired absence of both cervix and uterus: Secondary | ICD-10-CM | POA: Diagnosis not present

## 2020-12-18 DIAGNOSIS — Z01419 Encounter for gynecological examination (general) (routine) without abnormal findings: Secondary | ICD-10-CM | POA: Diagnosis not present

## 2020-12-18 DIAGNOSIS — N952 Postmenopausal atrophic vaginitis: Secondary | ICD-10-CM | POA: Diagnosis not present

## 2021-01-21 DIAGNOSIS — K048 Radicular cyst: Secondary | ICD-10-CM | POA: Diagnosis not present

## 2021-04-30 ENCOUNTER — Encounter: Payer: Self-pay | Admitting: Internal Medicine

## 2021-04-30 ENCOUNTER — Ambulatory Visit: Payer: BC Managed Care – PPO | Admitting: Internal Medicine

## 2021-04-30 ENCOUNTER — Other Ambulatory Visit: Payer: Self-pay

## 2021-04-30 VITALS — BP 126/82 | HR 89 | Temp 98.6°F | Ht 63.0 in | Wt 164.0 lb

## 2021-04-30 DIAGNOSIS — R011 Cardiac murmur, unspecified: Secondary | ICD-10-CM

## 2021-04-30 DIAGNOSIS — D509 Iron deficiency anemia, unspecified: Secondary | ICD-10-CM | POA: Diagnosis not present

## 2021-04-30 DIAGNOSIS — R29898 Other symptoms and signs involving the musculoskeletal system: Secondary | ICD-10-CM | POA: Insufficient documentation

## 2021-04-30 LAB — CBC WITH DIFFERENTIAL/PLATELET
Basophils Absolute: 0.1 10*3/uL (ref 0.0–0.1)
Basophils Relative: 1 % (ref 0.0–3.0)
Eosinophils Absolute: 0.1 10*3/uL (ref 0.0–0.7)
Eosinophils Relative: 1.6 % (ref 0.0–5.0)
HCT: 38.8 % (ref 36.0–46.0)
Hemoglobin: 13 g/dL (ref 12.0–15.0)
Lymphocytes Relative: 43.8 % (ref 12.0–46.0)
Lymphs Abs: 2.4 10*3/uL (ref 0.7–4.0)
MCHC: 33.6 g/dL (ref 30.0–36.0)
MCV: 87 fl (ref 78.0–100.0)
Monocytes Absolute: 0.3 10*3/uL (ref 0.1–1.0)
Monocytes Relative: 6.1 % (ref 3.0–12.0)
Neutro Abs: 2.6 10*3/uL (ref 1.4–7.7)
Neutrophils Relative %: 47.5 % (ref 43.0–77.0)
Platelets: 228 10*3/uL (ref 150.0–400.0)
RBC: 4.47 Mil/uL (ref 3.87–5.11)
RDW: 14.4 % (ref 11.5–15.5)
WBC: 5.4 10*3/uL (ref 4.0–10.5)

## 2021-04-30 NOTE — Patient Instructions (Signed)
Heart Murmur A heart murmur is an extra sound that is caused by chaotic blood flow through the valves of the heart. The murmur can be heard as a "hum" or "whoosh" sound when blood flows through the heart. There are two types of heart murmurs:  Innocent (benign) murmurs. Most people with this type of heart murmur do not have a heart problem. Many children have innocent heart murmurs. Your health care provider may suggest some basic tests to find out whether your murmur is an innocent murmur. If an innocent heart murmur is found, there is no need for further tests or treatment and no need to restrict activities or stop playing sports.  Abnormal murmurs. These types of murmurs can occur in children and adults. Abnormal murmurs may be a sign of a more serious heart condition, such as a heart defect present at birth (congenital defect) or heart valve disease. What are the causes? The heart has four areas called chambers. Valves separate the upper and lower chambers from each other (tricuspid valve and mitral valve) and separate the lower chambers of the heart from pathways that lead away from the heart (aortic valve and pulmonary valve). Normally, the valves open to let blood flow through or out of your heart, and then they shut to keep the blood from flowing backward. This condition is caused by heart valves that are not working properly.  In children, abnormal heart murmurs are typically caused by congenital defects.  In adults, abnormal murmurs are usually caused by heart valve problems from disease, infection, or aging. This condition may also be caused by:  Pregnancy.  Fever.  Overactive thyroid gland.  Anemia.  Exercise.  Rapid growth spurts (in children).   What are the signs or symptoms? Innocent murmurs do not cause symptoms, and many people with abnormal murmurs may not have symptoms. If symptoms do develop, they may include:  Shortness of breath.  Blue coloring of the skin,  especially on the fingertips.  Chest pain.  Palpitations, or feeling a fluttering or skipped heartbeat.  Fainting.  Persistent cough.  Getting tired much faster than expected.  Swelling in the abdomen, feet, or ankles. How is this diagnosed? This condition may be diagnosed during a routine physical or other exam. If your health care provider hears a murmur with a stethoscope, he or she will listen for:  Where the murmur is located in your heart.  How long the murmur lasts (duration).  When the murmur is heard during the heartbeat.  How loud the murmur is. This may help the health care provider figure out what is causing the murmur. You may be referred to a heart specialist (cardiologist). You may also have other tests, including:  Electrocardiogram (ECG or EKG). This test measures the electrical activity of your heart.  Echocardiogram. This test uses high frequency sound waves to make pictures of your heart.  MRI or chest X-ray.  Cardiac catheterization. This test looks at blood flow through the arteries around the heart. For children and adults who have an abnormal heart murmur and want to stay active, it is important to:  Complete testing.  Review test results.  Receive recommendations from your health care provider. If heart disease is present, it may not be safe to play or be active. How is this treated? Heart murmurs themselves do not need treatment. In some cases, a heart murmur may go away on its own. If an underlying problem or disease is causing the murmur, you may need treatment. If  treatment is needed, it will depend on the type and severity of the disease or heart problem causing the murmur. Treatment may include:  Medicine.  Surgery.  Dietary and lifestyle changes. Follow these instructions at home:  Talk with your health care provider before participating in sports or other activities that require a lot of effort and energy (are strenuous).  Learn as  much as possible about your condition and any related diseases. Ask your health care provider if you may be at risk for any medical emergencies.  Talk with your health care provider about what symptoms you should look out for.  It is up to you to get your test results. Ask your health care provider, or the department that is doing the test, when your results will be ready.  Keep all follow-up visits as told by your health care provider. This is important. Contact a health care provider if:  You are frequently short of breath.  You feel more tired than usual.  You are having a hard time keeping up with normal activities or fitness routines.  You have swelling in your ankles or feet.  You notice that your heart often beats irregularly.  You develop any new symptoms. Get help right away if:  You have chest pain.  You are having trouble breathing.  You feel light-headed or you pass out.  Your symptoms suddenly get worse. These symptoms may represent a serious problem that is an emergency. Do not wait to see if the symptoms will go away. Get medical help right away. Call your local emergency services (911 in the [place] Do not drive yourself to the hospital. Summary  Normally, the heart valves open to let blood flow through or out of your heart, and then they shut to keep the blood from flowing backward.  A heart murmur is caused by heart valves that are not working properly.  You may need treatment if an underlying problem or disease is causing the heart murmur. Treatment may include medicine, surgery, or dietary and lifestyle changes.  Talk with your health care provider before participating in sports or other activities that require a lot of effort and energy (are strenuous).  Talk with your health care provider about what symptoms you should watch out for. This information is not intended to replace advice given to you by your health care provider. Make sure you discuss any  questions you have with your health care provider. Document Revised: 05/18/2018 Document Reviewed: 05/18/2018 Elsevier Patient Education  [place].

## 2021-04-30 NOTE — Progress Notes (Addendum)
Subjective:  Patient ID: Kristina [name], female    DOB: [birth year]  Age: 55 y.o. MRN: [id number]  CC: Follow-up  This visit occurred during the SARS-CoV-2 public health emergency.  Safety protocols were in place, including screening questions prior to the visit, additional usage of staff PPE, and extensive cleaning of exam room while observing appropriate contact time as indicated for disinfecting solutions.    HPI Kristina [name] presents for f/up -   She complains of a 25-month history of heavy sensation in her legs.  The discomfort always occurs at rest.  It is worsened by sitting in an [place] style and is improved by activity.  She wanted to see a neurologist but they told her she had to have a referral for me before they would see her.  She has a history of PVCs but denies any recent episodes of chest pain, shortness of breath, or palpitations.  She denies claudication.  She denies lower extremity paresthesias.  She has a history of iron deficiency anemia.  Outpatient Medications Prior to Visit  Medication Sig Dispense Refill  . Cholecalciferol 50 MCG (2000 UT) TABS Take 1 tablet (2,000 Units total) by mouth daily. 90 tablet 3  . Dexlansoprazole (DEXILANT PO) Take by mouth.    . Dietary Management Product (FOSTEUM PLUS) CAPS Take 1 capsule by mouth 2 (two) times daily. 60 capsule 11  . triamcinolone (NASACORT) 55 MCG/ACT AERO nasal inhaler Place 2 sprays into the nose daily as needed.     . valACYclovir (VALTREX) 500 MG tablet Take 1,000 mg by mouth 2 (two) times daily as needed.     No facility-administered medications prior to visit.    ROS Review of Systems  Constitutional: Negative for diaphoresis and fatigue.  HENT: Negative.  Negative for trouble swallowing.   Eyes: Negative for visual disturbance.  Respiratory: Negative for chest tightness, shortness of breath and wheezing.   Cardiovascular: Negative for chest pain, palpitations and leg swelling.  Gastrointestinal:  Negative for abdominal pain.  Genitourinary: Negative.   Skin: Negative.   Neurological: Negative.  Negative for dizziness, weakness, light-headedness and headaches.  Hematological: Negative for adenopathy. Does not bruise/bleed easily.  Psychiatric/Behavioral: Negative.     Objective:  BP 126/82 (BP Location: Left Arm, Patient Position: Sitting, Cuff Size: Normal)   Pulse 89   Temp 98.6 F (37 C) (Oral)   Ht 5\' 3"  (1.6 m)   Wt 164 lb (74.4 kg)   LMP 09/29/2013   SpO2 97%   BMI 29.05 kg/m   BP Readings from Last 3 Encounters:  04/30/21 126/82  11/12/20 124/78  09/05/20 122/78    Wt Readings from Last 3 Encounters:  04/30/21 164 lb (74.4 kg)  11/12/20 156 lb (70.8 kg)  09/05/20 159 lb (72.1 kg)    Physical Exam Vitals reviewed.  Constitutional:      Appearance: Normal appearance.  HENT:     Nose: Nose normal.     Mouth/Throat:     Mouth: Mucous membranes are moist.  Eyes:     Conjunctiva/sclera: Conjunctivae normal.  Cardiovascular:     Rate and Rhythm: Normal rate and regular rhythm.     Pulses:          Carotid pulses are 1+ on the right side and 1+ on the left side.      Radial pulses are 1+ on the right side and 1+ on the left side.       Femoral pulses are 1+ on the right  side and 1+ on the left side.      Popliteal pulses are 1+ on the right side and 1+ on the left side.       Dorsalis pedis pulses are 1+ on the right side and 1+ on the left side.       Posterior tibial pulses are 1+ on the right side and 1+ on the left side.     Heart sounds: Murmur heard.   Systolic murmur is present with a grade of 1/6.  No diastolic murmur is present. No gallop.      Comments: 1/6 SEM RUSB Pulmonary:     Effort: Pulmonary effort is normal.     Breath sounds: No stridor. No wheezing, rhonchi or rales.  Abdominal:     General: Abdomen is flat. Bowel sounds are normal. There is no distension.     Palpations: Abdomen is soft. There is no mass.  Musculoskeletal:         General: No swelling or tenderness.     Cervical back: Neck supple.     Right lower leg: No edema.     Left lower leg: No edema.  Lymphadenopathy:     Cervical: No cervical adenopathy.  Skin:    General: Skin is warm and dry.  Neurological:     General: No focal deficit present.     Mental Status: She is alert and oriented to person, place, and time. Mental status is at baseline.     Cranial Nerves: Cranial nerves are intact.     Motor: Motor function is intact. No weakness.     Coordination: Coordination is intact. Coordination normal.     Deep Tendon Reflexes: Reflexes normal.     Reflex Scores:      Tricep reflexes are 0 on the right side and 0 on the left side.      Bicep reflexes are 0 on the right side and 0 on the left side.      Brachioradialis reflexes are 1+ on the right side and 1+ on the left side.      Patellar reflexes are 1+ on the right side and 1+ on the left side.      Achilles reflexes are 0 on the right side and 0 on the left side. Psychiatric:        Behavior: Behavior normal.     Lab Results  Component Value Date   WBC 5.4 04/30/2021   HGB 13.0 04/30/2021   HCT 38.8 04/30/2021   PLT 228.0 04/30/2021   GLUCOSE 97 03/06/2020   CHOL 191 09/05/2020   TRIG 75 09/05/2020   HDL 56 09/05/2020   LDLCALC 118 (H) 09/05/2020   ALT 13 03/06/2020   AST 14 03/06/2020   NA 136 03/06/2020   K 3.9 03/06/2020   CL 103 03/06/2020   CREATININE 0.61 03/06/2020   BUN 12 03/06/2020   CO2 27 03/06/2020   TSH 2.03 03/06/2020   HGBA1C 5.7 03/06/2020    DG Bone Density  Result Date: 11/03/2020 Date of study: 10/30/20 Exam: DUAL X-RAY ABSORPTIOMETRY (DXA) FOR BONE MINERAL DENSITY (BMD) Instrument: [employer] Provider: PCP Indication: follow up for low BMD Comparison: none (please note that it is not possible to compare data from different instruments) Clinical data: Pt is a 55 y.o. female without previous history of fracture. On calcium and vitamin  D. Results:  Lumbar spine L1-L4 Femoral neck (FN) 33% distal radius T-score -0.1 RFN: -0.8 LFN: -1.2 n/a Change in  BMD from previous DXA test (%) n/a n/a n/a (*) statistically significant Assessment: the BMD is low according to the [facility] classification for osteoporosis (see below). Fracture risk: moderate FRAX score: 10 year major osteoporotic risk: 2.7%. 10 year hip fracture risk: 0.2%. The thresholds for treatment are 20% and 3%, respectively. Comments: the technical quality of the study is good. Evaluation for secondary causes should be considered if clinically indicated. Recommend optimizing calcium (1200 mg/day) and vitamin D (800 IU/day) intake. Followup: Repeat BMD is appropriate after 2 years or after 1-2 years if starting treatment. WHO criteria for diagnosis of osteoporosis in postmenopausal women and in men 6 y/o or older: - normal: T-score -1.0 to + 1.0 - osteopenia/low bone density: T-score between -2.5 and -1.0 - osteoporosis: T-score below -2.5 - severe osteoporosis: T-score below -2.5 with history of fragility fracture Note: although not part of the WHO classification, the presence of a fragility fracture, regardless of the T-score, should be considered diagnostic of osteoporosis, provided other causes for the fracture have been excluded. Treatment: The National Osteoporosis Foundation recommends that treatment be considered in postmenopausal women and men age 11 or older with: 1. Hip or vertebral (clinical or morphometric) fracture 2. T-score of - 2.5 or lower at the spine or hip 3. 10-year fracture probability by FRAX of at least 20% for a major osteoporotic fracture and 3% for a hip fracture Loura Pardon MD    Assessment & Plan:   Neeley was seen today for follow-up.  Diagnoses and all orders for this visit:  Leg heaviness- Her exam is within normal limits.  I do not have an explanation for her symptoms.  She will see neurology. -     CBC with Differential/Platelet; Future -      Ambulatory referral to Neurology -     CBC with Differential/Platelet  Iron deficiency anemia, unspecified iron deficiency anemia type- Her H&H are normal now. -     CBC with Differential/Platelet; Future -     CBC with Differential/Platelet  Systolic murmur- I have asked her to undergo an echocardiogram to see if she has valvular heart disease. -     ECHOCARDIOGRAM COMPLETE; Future   I am having Zhara L. Blasdell maintain her valACYclovir, triamcinolone, Dexlansoprazole (DEXILANT PO), Cholecalciferol, and Fosteum Plus.  No orders of the defined types were placed in this encounter.    Follow-up: Return in about 3 months (around 07/31/2021).  Scarlette Calico, MD

## 2021-05-02 DIAGNOSIS — H04123 Dry eye syndrome of bilateral lacrimal glands: Secondary | ICD-10-CM | POA: Diagnosis not present

## 2021-05-16 ENCOUNTER — Ambulatory Visit (HOSPITAL_COMMUNITY): Payer: BC Managed Care – PPO

## 2021-05-23 ENCOUNTER — Ambulatory Visit (HOSPITAL_COMMUNITY)
Admission: RE | Admit: 2021-05-23 | Discharge: 2021-05-23 | Disposition: A | Discharge status: Home / Self Care | Payer: BC Managed Care – PPO | Source: Ambulatory Visit | Attending: Internal Medicine | Admitting: Internal Medicine

## 2021-05-23 ENCOUNTER — Other Ambulatory Visit: Payer: Self-pay

## 2021-05-23 DIAGNOSIS — R011 Cardiac murmur, unspecified: Secondary | ICD-10-CM | POA: Diagnosis not present

## 2021-05-23 DIAGNOSIS — E785 Hyperlipidemia, unspecified: Secondary | ICD-10-CM | POA: Diagnosis not present

## 2021-05-23 DIAGNOSIS — Z87891 Personal history of nicotine dependence: Secondary | ICD-10-CM | POA: Diagnosis not present

## 2021-05-23 LAB — ECHOCARDIOGRAM COMPLETE
Area-P 1/2: 3.02 cm2
S' Lateral: 2.6 cm

## 2021-05-23 NOTE — Progress Notes (Signed)
  Echocardiogram 2D Echocardiogram has been performed.  Jennette Dubin 05/23/2021, 2:41 PM

## 2021-05-31 DIAGNOSIS — R112 Nausea with vomiting, unspecified: Secondary | ICD-10-CM | POA: Diagnosis not present

## 2021-05-31 DIAGNOSIS — K219 Gastro-esophageal reflux disease without esophagitis: Secondary | ICD-10-CM | POA: Diagnosis not present

## 2021-06-02 DIAGNOSIS — R945 Abnormal results of liver function studies: Secondary | ICD-10-CM | POA: Diagnosis not present

## 2021-06-02 DIAGNOSIS — R110 Nausea: Secondary | ICD-10-CM | POA: Diagnosis not present

## 2021-06-02 DIAGNOSIS — Z79899 Other long term (current) drug therapy: Secondary | ICD-10-CM | POA: Diagnosis not present

## 2021-06-02 DIAGNOSIS — E663 Overweight: Secondary | ICD-10-CM | POA: Diagnosis not present

## 2021-06-02 DIAGNOSIS — R350 Frequency of micturition: Secondary | ICD-10-CM | POA: Diagnosis not present

## 2021-06-02 DIAGNOSIS — Z131 Encounter for screening for diabetes mellitus: Secondary | ICD-10-CM | POA: Diagnosis not present

## 2021-06-02 DIAGNOSIS — R8290 Unspecified abnormal findings in urine: Secondary | ICD-10-CM | POA: Diagnosis not present

## 2021-06-02 DIAGNOSIS — E7800 Pure hypercholesterolemia, unspecified: Secondary | ICD-10-CM | POA: Diagnosis not present

## 2021-06-02 DIAGNOSIS — D539 Nutritional anemia, unspecified: Secondary | ICD-10-CM | POA: Diagnosis not present

## 2021-06-04 DIAGNOSIS — R112 Nausea with vomiting, unspecified: Secondary | ICD-10-CM | POA: Diagnosis not present

## 2021-06-06 DIAGNOSIS — R945 Abnormal results of liver function studies: Secondary | ICD-10-CM | POA: Diagnosis not present

## 2021-06-26 ENCOUNTER — Encounter: Payer: Self-pay | Admitting: Diagnostic Neuroimaging

## 2021-06-26 ENCOUNTER — Ambulatory Visit: Payer: BC Managed Care – PPO | Admitting: Diagnostic Neuroimaging

## 2021-06-26 VITALS — BP 124/88 | HR 75 | Ht 63.0 in | Wt 162.0 lb

## 2021-06-26 DIAGNOSIS — R945 Abnormal results of liver function studies: Secondary | ICD-10-CM | POA: Diagnosis not present

## 2021-06-26 DIAGNOSIS — R300 Dysuria: Secondary | ICD-10-CM | POA: Diagnosis not present

## 2021-06-26 DIAGNOSIS — R29898 Other symptoms and signs involving the musculoskeletal system: Secondary | ICD-10-CM | POA: Diagnosis not present

## 2021-06-26 DIAGNOSIS — R0781 Pleurodynia: Secondary | ICD-10-CM | POA: Diagnosis not present

## 2021-06-26 NOTE — Patient Instructions (Signed)
  INTERMITTENT LEG HEAVINESS (since 2021; now spontaneously improving) - monitor symptoms

## 2021-06-26 NOTE — Progress Notes (Signed)
[facility]  PATIENT: Kristina [name] DOB: Dec 14, 1965  REFERRING CLINICIAN: Janith Lima, MD HISTORY FROM: patient  REASON FOR VISIT: new consult    HISTORICAL  CHIEF COMPLAINT:  Chief Complaint  Patient presents with   Leg heaviness    Rm 7 New Pt  "not much anymore but still wanted to be seen"    HISTORY OF PRESENT ILLNESS:   55 year old female here for evaluation of intermittent leg weakness.  Around December 2021 patient started having intermittent leg heaviness sensations, especially if she would move from crosslegged sitting position up to standing position.  Symptoms would last for few minutes of the time and then resolved.  Symptoms affecting both legs.  No significant numbness or pain.  No problems with upper body, neck, face, vision, speech.  Symptoms have spontaneously improved over the past 1 to 2 months.  Separately patient had episode of food contamination from online food delivery service.  This happened about 1 month ago.  She had nausea, vomiting, stomach pain issues.  She had LFT elevations.  She was seen by GI specialist.  Apparently 500 other customers also had similar symptoms from this product.  Fortunately symptoms are improving.  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    REVIEW OF SYSTEMS: Full 14 system review of systems performed and negative with exception of: as per HPI.  ALLERGIES: Allergies  Allergen Reactions   Fluconazole Other (See Comments)    Other   Metronidazole Other (See Comments)    headache   Percocet [Oxycodone-Acetaminophen] Nausea And Vomiting    "out of her mind"   Tetracyclines & Related Nausea Only    HOME MEDICATIONS: Outpatient Medications Prior to Visit  Medication Sig Dispense Refill   Cholecalciferol 50 MCG (2000 UT) TABS Take 1 tablet (2,000 Units total) by mouth daily. 90 tablet 3    Dexlansoprazole (DEXILANT PO) Take by mouth.     Dietary Management Product (FOSTEUM PLUS) CAPS Take 1 capsule by mouth 2 (two) times daily. 60 capsule 11   triamcinolone (NASACORT) 55 MCG/ACT AERO nasal inhaler Place 2 sprays into the nose daily as needed.      valACYclovir (VALTREX) 500 MG tablet Take 1,000 mg by mouth 2 (two) times daily as needed.     No facility-administered medications prior to visit.    PAST MEDICAL HISTORY: Past Medical History:  Diagnosis Date   Anemia    history   Anxiety    no meds   Dysrhythmia    HX -pt states she wore a holter in distant past for pvc-told d/t caffeine intake   GERD (gastroesophageal reflux disease)    occ - otc prn   Headache(784.0)    otc med prn   HSV infection    Ovarian cyst, right    PONV (postoperative nausea and vomiting)     PAST SURGICAL HISTORY: Past Surgical History:  Procedure Laterality Date   ABLATION     DIAGNOSTIC LAPAROSCOPY     DILATATION & CURRETTAGE/HYSTEROSCOPY WITH RESECTOCOPE N/A 01/28/2013   Procedure: DILATATION & CURETTAGE/HYSTEROSCOPY WITH RESECTOCOPE resection of submucosal fibroid;  Surgeon: Marvene Staff, MD;  Location: [facility];  Service: Gynecology;  Laterality: N/A;   diagnostic hysteroscopy with resection of submucosal fibroid   LEFT OOPHORECTOMY     mym     MYOMECTOMY     ROBOTIC ASSISTED TOTAL HYSTERECTOMY N/A 03/10/2014  Procedure: ROBOTIC ASSISTED TOTAL HYSTERECTOMY;  Surgeon: Marvene Staff, MD;  Location: [facility];  Service: Gynecology;  Laterality: N/A;   TUBAL LIGATION     on right side only,    UNILATERAL SALPINGECTOMY     UNILATERAL SALPINGECTOMY Right 03/10/2014   Procedure: UNILATERAL SALPINGECTOMY;  Surgeon: Marvene Staff, MD;  Location: [facility];  Service: Gynecology;  Laterality: Right;    FAMILY HISTORY: Family History  Problem Relation Age of Onset   Hypertension Mother    Macular degeneration Mother    Diabetes Father     SOCIAL HISTORY: Social History    Socioeconomic History   Marital status: Married    Spouse name: Not on file   Number of children: 0   Years of education: Not on file   Highest education level: Master's degree (e.g., MA, MS, MEng, MEd, MSW, MBA)  Occupational History   Not on file  Tobacco Use   Smoking status: Former    Packs/day: 0.25    Years: 10.00    Pack years: 2.50    Types: Cigarettes    Quit date: 01/21/1993    Years since quitting: 28.4   Smokeless tobacco: Never  Substance and Sexual Activity   Alcohol use: Not Currently    Alcohol/week: 14.0 standard drinks    Types: 14 Shots of liquor per week    Comment: 06/26/21 stopped , has at least glass of wine every day; whiskey daily   Drug use: No   Sexual activity: Yes    Partners: Male    Birth control/protection: Surgical    Comment: last week  Other Topics Concern   Not on file  Social History Narrative   Lives with spouse   Occas coffee   Social Determinants of Health   Financial Resource Strain: Not on file  Food Insecurity: Not on file  Transportation Needs: Not on file  Physical Activity: Not on file  Stress: Not on file  Social Connections: Not on file  Intimate Partner Violence: Not on file     PHYSICAL EXAM  GENERAL EXAM/CONSTITUTIONAL: Vitals:  Vitals:   06/26/21 0845 06/26/21 0857  BP: 130/88 124/88  Pulse: 72 75  Weight: 162 lb (73.5 kg)   Height: 5\' 3"  (1.6 m)    Body mass index is 28.7 kg/m. Wt Readings from Last 3 Encounters:  06/26/21 162 lb (73.5 kg)  04/30/21 164 lb (74.4 kg)  11/12/20 156 lb (70.8 kg)   Patient is in no distress; well developed, nourished and groomed; neck is supple  CARDIOVASCULAR: Examination of carotid arteries is normal; no carotid bruits Regular rate and rhythm, no murmurs Examination of peripheral vascular system by observation and palpation is normal  EYES: Ophthalmoscopic exam of optic discs and posterior segments is normal; no papilledema or hemorrhages No results  found.  MUSCULOSKELETAL: Gait, strength, tone, movements noted in Neurologic exam below  NEUROLOGIC: MENTAL STATUS:  No flowsheet data found. awake, alert, oriented to person, place and time recent and remote memory intact normal attention and concentration language fluent, comprehension intact, naming intact fund of knowledge appropriate  CRANIAL NERVE:  2nd - no papilledema on fundoscopic exam 2nd, 3rd, 4th, 6th - pupils equal and reactive to light, visual fields full to confrontation, extraocular muscles intact, no nystagmus 5th - facial sensation symmetric 7th - facial strength symmetric 8th - hearing intact 9th - palate elevates symmetrically, uvula midline 11th - shoulder shrug symmetric 12th - tongue protrusion midline  MOTOR:  normal bulk and tone,  full strength in the BUE, BLE  SENSORY:  normal and symmetric to light touch, temperature, vibration  COORDINATION:  finger-nose-finger, fine finger movements normal  REFLEXES:  deep tendon reflexes present and symmetric  GAIT/STATION:  narrow based gait     DIAGNOSTIC DATA (LABS, IMAGING, TESTING) - I reviewed patient records, labs, notes, testing and imaging myself where available.  Lab Results  Component Value Date   WBC 5.4 04/30/2021   HGB 13.0 04/30/2021   HCT 38.8 04/30/2021   MCV 87.0 04/30/2021   PLT 228.0 04/30/2021      Component Value Date/Time   NA 136 03/06/2020 1414   K 3.9 03/06/2020 1414   CL 103 03/06/2020 1414   CO2 27 03/06/2020 1414   GLUCOSE 97 03/06/2020 1414   BUN 12 03/06/2020 1414   CREATININE 0.61 03/06/2020 1414   CALCIUM 9.4 03/06/2020 1414   PROT 7.8 03/06/2020 1414   ALBUMIN 4.5 03/06/2020 1414   AST 14 03/06/2020 1414   ALT 13 03/06/2020 1414   ALKPHOS 86 03/06/2020 1414   BILITOT 0.5 03/06/2020 1414   GFRNONAA >60 03/08/2018 2005   GFRAA >60 03/08/2018 2005   Lab Results  Component Value Date   CHOL 191 09/05/2020   HDL 56 09/05/2020   LDLCALC 118 (H)  09/05/2020   TRIG 75 09/05/2020   CHOLHDL 3.4 09/05/2020   Lab Results  Component Value Date   HGBA1C 5.7 03/06/2020   No results found for: VITAMINB12 Lab Results  Component Value Date   TSH 2.03 03/06/2020       ASSESSMENT AND PLAN  55 y.o. year old female here with intermittent leg heaviness starting in 2021, with few minutes of symptoms occurring after moving from sitting to standing position.  Symptoms have spontaneously improved.  Neurologic stimulation normal.  Could represent intermittent nerve irritation or compression with certain positions.  Dx:  1. Leg weakness, bilateral       PLAN:  INTERMITTENT LEG HEAVINESS (since 2021; now spontaneously improving) - monitor symptoms; may consider MRI lumbar spine, EMG, labs in future  Return for pending if symptoms worsen or fail to improve, return to PCP.    Penni Bombard, MD 1/65/7903, 8:33 AM Certified in Neurology, Neurophysiology and Neuroimaging  [facility] [address] [place], [place] [address] [phone]

## 2021-07-01 DIAGNOSIS — J40 Bronchitis, not specified as acute or chronic: Secondary | ICD-10-CM | POA: Diagnosis not present

## 2021-07-01 DIAGNOSIS — R509 Fever, unspecified: Secondary | ICD-10-CM | POA: Diagnosis not present

## 2021-07-01 DIAGNOSIS — Z20822 Contact with and (suspected) exposure to covid-19: Secondary | ICD-10-CM | POA: Diagnosis not present

## 2021-07-05 DIAGNOSIS — Z9189 Other specified personal risk factors, not elsewhere classified: Secondary | ICD-10-CM | POA: Diagnosis not present

## 2021-07-05 DIAGNOSIS — J40 Bronchitis, not specified as acute or chronic: Secondary | ICD-10-CM | POA: Diagnosis not present

## 2021-07-27 DIAGNOSIS — K760 Fatty (change of) liver, not elsewhere classified: Secondary | ICD-10-CM | POA: Diagnosis not present

## 2021-08-28 DIAGNOSIS — Z79899 Other long term (current) drug therapy: Secondary | ICD-10-CM | POA: Diagnosis not present

## 2021-08-28 DIAGNOSIS — E8881 Metabolic syndrome: Secondary | ICD-10-CM | POA: Diagnosis not present

## 2021-08-28 DIAGNOSIS — R0602 Shortness of breath: Secondary | ICD-10-CM | POA: Diagnosis not present

## 2021-08-28 DIAGNOSIS — E559 Vitamin D deficiency, unspecified: Secondary | ICD-10-CM | POA: Diagnosis not present

## 2021-08-28 DIAGNOSIS — Z131 Encounter for screening for diabetes mellitus: Secondary | ICD-10-CM | POA: Diagnosis not present

## 2021-08-28 DIAGNOSIS — R5383 Other fatigue: Secondary | ICD-10-CM | POA: Diagnosis not present

## 2021-08-28 DIAGNOSIS — E349 Endocrine disorder, unspecified: Secondary | ICD-10-CM | POA: Diagnosis not present

## 2021-08-28 DIAGNOSIS — E7800 Pure hypercholesterolemia, unspecified: Secondary | ICD-10-CM | POA: Diagnosis not present

## 2021-08-28 DIAGNOSIS — D539 Nutritional anemia, unspecified: Secondary | ICD-10-CM | POA: Diagnosis not present

## 2021-08-28 DIAGNOSIS — E663 Overweight: Secondary | ICD-10-CM | POA: Diagnosis not present

## 2021-08-28 DIAGNOSIS — Z1331 Encounter for screening for depression: Secondary | ICD-10-CM | POA: Diagnosis not present

## 2021-08-28 DIAGNOSIS — R635 Abnormal weight gain: Secondary | ICD-10-CM | POA: Diagnosis not present

## 2021-09-09 DIAGNOSIS — M8580 Other specified disorders of bone density and structure, unspecified site: Secondary | ICD-10-CM | POA: Diagnosis not present

## 2021-09-24 DIAGNOSIS — J302 Other seasonal allergic rhinitis: Secondary | ICD-10-CM | POA: Diagnosis not present

## 2021-09-24 DIAGNOSIS — Z20822 Contact with and (suspected) exposure to covid-19: Secondary | ICD-10-CM | POA: Diagnosis not present

## 2021-09-24 DIAGNOSIS — J018 Other acute sinusitis: Secondary | ICD-10-CM | POA: Diagnosis not present

## 2021-10-03 DIAGNOSIS — K219 Gastro-esophageal reflux disease without esophagitis: Secondary | ICD-10-CM | POA: Diagnosis not present

## 2021-10-03 DIAGNOSIS — Z1211 Encounter for screening for malignant neoplasm of colon: Secondary | ICD-10-CM | POA: Diagnosis not present

## 2021-10-08 DIAGNOSIS — K200 Eosinophilic esophagitis: Secondary | ICD-10-CM | POA: Diagnosis not present

## 2021-10-09 DIAGNOSIS — Z23 Encounter for immunization: Secondary | ICD-10-CM | POA: Diagnosis not present

## 2021-10-09 DIAGNOSIS — K219 Gastro-esophageal reflux disease without esophagitis: Secondary | ICD-10-CM | POA: Diagnosis not present

## 2021-10-09 DIAGNOSIS — Z111 Encounter for screening for respiratory tuberculosis: Secondary | ICD-10-CM | POA: Diagnosis not present

## 2021-10-09 DIAGNOSIS — R7303 Prediabetes: Secondary | ICD-10-CM | POA: Diagnosis not present

## 2021-10-09 DIAGNOSIS — Z6829 Body mass index (BMI) 29.0-29.9, adult: Secondary | ICD-10-CM | POA: Diagnosis not present

## 2021-11-14 DIAGNOSIS — R1084 Generalized abdominal pain: Secondary | ICD-10-CM | POA: Diagnosis not present

## 2021-11-14 DIAGNOSIS — K219 Gastro-esophageal reflux disease without esophagitis: Secondary | ICD-10-CM | POA: Diagnosis not present

## 2021-11-21 DIAGNOSIS — R1084 Generalized abdominal pain: Secondary | ICD-10-CM | POA: Diagnosis not present

## 2022-12-11 LAB — HM MAMMOGRAPHY

## 2023-08-31 ENCOUNTER — Other Ambulatory Visit (HOSPITAL_COMMUNITY): Payer: Self-pay

## 2023-08-31 MED ORDER — WEGOVY 0.25 MG/0.5ML ~~LOC~~ SOAJ
0.2500 mg | SUBCUTANEOUS | 0 refills | Status: DC
  Filled 2023-09-03 – 2023-09-16 (×8): qty 2, 28d supply, fill #0

## 2023-09-03 ENCOUNTER — Other Ambulatory Visit (HOSPITAL_COMMUNITY): Payer: Self-pay

## 2023-09-04 ENCOUNTER — Encounter (HOSPITAL_COMMUNITY): Payer: Self-pay

## 2023-09-04 ENCOUNTER — Other Ambulatory Visit (HOSPITAL_COMMUNITY): Payer: Self-pay

## 2023-09-08 ENCOUNTER — Other Ambulatory Visit: Payer: Self-pay | Admitting: Otolaryngology

## 2023-09-09 LAB — SURGICAL PATHOLOGY

## 2023-09-10 ENCOUNTER — Other Ambulatory Visit (HOSPITAL_COMMUNITY): Payer: Self-pay

## 2023-09-15 ENCOUNTER — Other Ambulatory Visit (HOSPITAL_COMMUNITY): Payer: Self-pay

## 2023-09-16 ENCOUNTER — Other Ambulatory Visit (HOSPITAL_COMMUNITY): Payer: Self-pay

## 2023-09-29 ENCOUNTER — Other Ambulatory Visit (HOSPITAL_COMMUNITY): Payer: Self-pay

## 2024-01-05 LAB — HM DEXA SCAN

## 2024-02-19 ENCOUNTER — Other Ambulatory Visit (HOSPITAL_BASED_OUTPATIENT_CLINIC_OR_DEPARTMENT_OTHER): Payer: Self-pay

## 2024-05-25 NOTE — Progress Notes (Signed)
 HPI: [name] is a 58 y.o. female with PMHx significant for PVCs, anxiety/depression,eczema, overweight (BMI 25.0-29.9),and hyperlipidemia who is here today to establish care.  Former PCP: Dr. Debby [name] Last preventive routine visit: 2021 Follows with her gynecologist regularly for gyn preventive care and wt loss management.  Exercise: not regularly Diet: eating somewhat healthier, taking a GLP-1 for weight loss Sleep: 6-7.5 hours Alcohol Use: 1 shot or 1 glass of wine daily Smoking: Former Vision: [facility], last year Dental: Regularly  Chronic medical problems:   Weight Loss Managed with Zepbound 12.5 mg weekly, says that when prior to starting she weighed 172 lbs, now 141 lbs.   Concerns today:   HLD Currently she is not on pharmacologic treatment. Says that she has Fhx of Diabetes in her Father, Hypertension in her Mother. Describes her diet: doesn't eat tons of fried foods, does occasionally cook with bacon grease, eats lots of boiled eggs, pork belly (maybe every 1.5). Not a lot of heavy carbs, loves ramen broth.  Does have personal hx of Prediabetes.  Lipid Panel  02/16/2024  Cholesterol 292  Triglycerides 129  HDL 101  LDL 169    C-MET 6/88/7974  BUN 21  Creatinine 0.88    HgbA1c 02/16/2024  5.6   Hx of Elevated Bilirubin Resolved now, no subsequent scarring 02/16/2024 Total Bilirubin 0.5  Eczema: Follows with dermatology regularly, currently on Rinvoq.  Gyn care UTD, Gyn is Dr. Kandyce  Review of Systems  Constitutional:  Negative for activity change, appetite change and fever.  HENT:  Negative for nosebleeds, sore throat and trouble swallowing.   Eyes:  Negative for redness and visual disturbance.  Respiratory:  Negative for cough, shortness of breath and wheezing.   Cardiovascular:  Negative for chest pain, palpitations and leg swelling.  Gastrointestinal:  Negative for abdominal pain, nausea and vomiting.  Endocrine: Negative  for cold intolerance and heat intolerance.  Genitourinary:  Negative for decreased urine volume, dysuria and hematuria.  Musculoskeletal:  Negative for gait problem and myalgias.  Neurological:  Negative for seizures, syncope, weakness, numbness and headaches.  See other pertinent positives and negatives in HPI.  Current Outpatient Medications on File Prior to Visit  Medication Sig Dispense Refill   tirzepatide (ZEPBOUND) 12.5 MG/0.5ML Pen Inject 12.5 mg into the skin once a week.     Upadacitinib (RINVOQ PO) Take by mouth.     No current facility-administered medications on file prior to visit.   Past Medical History:  Diagnosis Date   Anemia    history   Anxiety    no meds   Dysrhythmia    HX -pt states she wore a holter in distant past for pvc-told d/t caffeine intake   GERD (gastroesophageal reflux disease)    occ - otc prn   Headache(784.0)    otc med prn   HSV infection    Ovarian cyst, right    PONV (postoperative nausea and vomiting)    Allergies  Allergen Reactions   Fluconazole Other (See Comments)    Other   Metronidazole Other (See Comments)    headache   Percocet [Oxycodone -Acetaminophen ] Nausea And Vomiting    out of her mind   Tetracyclines & Related Nausea Only   Family History  Problem Relation Age of Onset   Hypertension Mother    Macular degeneration Mother    Diabetes Father    Social History   Socioeconomic History   Marital status: Married    Spouse name: Not  on file   Number of children: 0   Years of education: Not on file   Highest education level: Master's degree (e.g., MA, MS, MEng, MEd, MSW, MBA)  Occupational History   Not on file  Tobacco Use   Smoking status: Former    Current packs/day: 0.00    Average packs/day: 0.3 packs/day for 10.0 years (2.5 ttl pk-yrs)    Types: Cigarettes    Start date: 01/21/1983    Quit date: 01/21/1993    Years since quitting: 31.3   Smokeless tobacco: Never  Substance and Sexual Activity    Alcohol use: Not Currently    Alcohol/week: 14.0 standard drinks of alcohol    Types: 14 Shots of liquor per week    Comment: 06/26/21 stopped , has at least glass of wine every day; whiskey daily   Drug use: No   Sexual activity: Yes    Partners: Male    Birth control/protection: Surgical    Comment: last week  Other Topics Concern   Not on file  Social History Narrative   Lives with spouse   Occas coffee   Social Drivers of Health   Financial Resource Strain: Not on file  Food Insecurity: Low Risk  (08/14/2023)   Received from [facility]   Hunger Vital Sign    Within the past 12 months, you worried that your food would run out before you got money to buy more: Never true    Within the past 12 months, the food you bought just didn't last and you didn't have money to get more. : Never true  Transportation Needs: Not on file  Physical Activity: Not on file  Stress: Not on file  Social Connections: Not on file   Vitals:   06/01/24 0911  BP: 100/60  Pulse: 95  Resp: 16  Temp: 98.6 F (37 C)  SpO2: 96%   Body mass index is 25.03 kg/m.  Physical Exam Vitals and nursing note reviewed.  Constitutional:      General: She is not in acute distress.    Appearance: She is well-developed.  HENT:     Head: Normocephalic and atraumatic.     Mouth/Throat:     Mouth: Mucous membranes are moist.     Pharynx: Oropharynx is clear.   Eyes:     Conjunctiva/sclera: Conjunctivae normal.    Cardiovascular:     Rate and Rhythm: Normal rate and regular rhythm.     Pulses:          Dorsalis pedis pulses are 2+ on the right side and 2+ on the left side.     Heart sounds: No murmur heard. Pulmonary:     Effort: Pulmonary effort is normal. No respiratory distress.     Breath sounds: Normal breath sounds.  Abdominal:     Palpations: Abdomen is soft. There is no hepatomegaly or mass.     Tenderness: There is no abdominal tenderness.   Musculoskeletal:     Right lower leg: No edema.      Left lower leg: No edema.  Lymphadenopathy:     Cervical: No cervical adenopathy.   Skin:    General: Skin is warm.     Findings: No erythema or rash.   Neurological:     General: No focal deficit present.     Mental Status: She is alert and oriented to person, place, and time.     Cranial Nerves: No cranial nerve deficit.     Gait: Gait normal.  Psychiatric:        Mood and Affect: Mood and affect normal.   ASSESSMENT AND PLAN: [name] was seen today to establish care.   Hyperlipidemia, unspecified hyperlipidemia type Assessment & Plan: We discussed current guidelines in regard to pharmacologic treatment as well as LDL goals. Her cholesterol has gradually gone up for some time, we discussed treatment options. She agrees with starting rosuvastatin 5 mg daily, we discussed son side effects. Fasting lipid panel and ALT in 8 weeks.  Orders: -     Rosuvastatin Calcium; Take 1 tablet (5 mg total) by mouth daily.  Dispense: 90 tablet; Refill: 3 -     ALT; Future -     Lipid panel; Future  Encounter for hepatitis C screening test for low risk patient -     Hepatitis C antibody; Future  I spent a total of 41 minutes in both face to face and non face to face activities for this visit on the date of this encounter. During this time history was obtained and documented, examination was performed, prior labs reviewed, and assessment/plan discussed.  Return in about 1 year (around 06/01/2025) for CPE, Labs.  I,Emily Lagle,acting as a Neurosurgeon for Margreat Widener [place], MD.,have documented all relevant documentation on the behalf of Kristina Dolson [place], MD,as directed by  Daiden Coltrane [place], MD while in the presence of Kristina Burgert [place], MD.   I, Jayah Balthazar [place], MD, have reviewed all documentation for this visit. The documentation on 06/01/24 for the exam, diagnosis, procedures, and orders are all accurate and complete.  Ara Grandmaison G. [place], MD  [facility]. [place] office.

## 2024-06-01 ENCOUNTER — Ambulatory Visit: Admitting: Family Medicine

## 2024-06-01 ENCOUNTER — Encounter: Payer: Self-pay | Admitting: Family Medicine

## 2024-06-01 VITALS — BP 100/60 | HR 95 | Temp 98.6°F | Resp 16 | Ht 63.0 in | Wt 141.3 lb

## 2024-06-01 DIAGNOSIS — Z1159 Encounter for screening for other viral diseases: Secondary | ICD-10-CM | POA: Diagnosis not present

## 2024-06-01 DIAGNOSIS — E785 Hyperlipidemia, unspecified: Secondary | ICD-10-CM

## 2024-06-01 MED ORDER — ROSUVASTATIN CALCIUM 5 MG PO TABS: 5.0000 mg | ORAL_TABLET | Freq: Every day | ORAL | 3 refills | Status: AC

## 2024-06-01 NOTE — Patient Instructions (Addendum)
 A few things to remember from today's visit:  Hyperlipidemia, unspecified hyperlipidemia type - Plan: rosuvastatin (CRESTOR) 5 MG tablet Fasting labs in 8 weeks.  If you need refills for medications you take chronically, please call your pharmacy. Do not use My Chart to request refills or for acute issues that need immediate attention. If you send a my chart message, it may take a few days to be addressed, specially if I am not in the office.  Please be sure medication list is accurate. If a new problem present, please set up appointment sooner than planned today.

## 2024-06-01 NOTE — Assessment & Plan Note (Addendum)
 We discussed current guidelines in regard to pharmacologic treatment as well as LDL goals. Her cholesterol has gradually gone up for some time, we discussed treatment options. She agrees with starting rosuvastatin 5 mg daily, we discussed son side effects. Fasting lipid panel and ALT in 8 weeks.

## 2024-06-02 ENCOUNTER — Other Ambulatory Visit: Payer: Self-pay | Admitting: Obstetrics

## 2024-06-02 DIAGNOSIS — Z9189 Other specified personal risk factors, not elsewhere classified: Secondary | ICD-10-CM

## 2024-06-07 ENCOUNTER — Ambulatory Visit
Admission: RE | Admit: 2024-06-07 | Discharge: 2024-06-07 | Disposition: A | Discharge status: Home / Self Care | Source: Ambulatory Visit | Attending: Obstetrics | Admitting: Obstetrics

## 2024-06-07 DIAGNOSIS — Z9189 Other specified personal risk factors, not elsewhere classified: Secondary | ICD-10-CM

## 2024-07-27 ENCOUNTER — Other Ambulatory Visit (INDEPENDENT_AMBULATORY_CARE_PROVIDER_SITE_OTHER): Payer: Self-pay

## 2024-07-27 ENCOUNTER — Other Ambulatory Visit

## 2024-07-27 DIAGNOSIS — Z1159 Encounter for screening for other viral diseases: Secondary | ICD-10-CM

## 2024-07-27 DIAGNOSIS — E785 Hyperlipidemia, unspecified: Secondary | ICD-10-CM

## 2024-07-27 LAB — LIPID PANEL
Cholesterol: 197 mg/dL (ref 0–200)
HDL: 82.1 mg/dL (ref >39.00)
LDL Cholesterol: 102 mg/dL — ABNORMAL HIGH (ref 0–99)
NonHDL: 114.66
Total CHOL/HDL Ratio: 2
Triglycerides: 61 mg/dL (ref 0.0–149.0)
VLDL: 12.2 mg/dL (ref 0.0–40.0)

## 2024-07-27 LAB — ALT: ALT: 14 U/L (ref 0–35)

## 2024-07-28 LAB — HEPATITIS C ANTIBODY: Hepatitis C Ab: NONREACTIVE

## 2024-08-01 ENCOUNTER — Telehealth: Payer: Self-pay

## 2024-08-01 ENCOUNTER — Ambulatory Visit: Payer: Self-pay | Admitting: Family Medicine

## 2024-08-01 NOTE — Telephone Encounter (Signed)
Waiting on PCP to review

## 2024-08-01 NOTE — Telephone Encounter (Signed)
 Copied from CRM #[id number]. Topic: Clinical - Lab/Test Results >> Aug 01, 2024 11:53 AM Rosina BIRCH wrote: Reason for CRM: patient called stating she would like the cma to call her in regards to her lab results CB [phone]

## 2024-12-19 ENCOUNTER — Ambulatory Visit: Payer: Self-pay | Admitting: Family Medicine

## 2024-12-19 ENCOUNTER — Encounter: Payer: Self-pay | Admitting: Family Medicine

## 2024-12-19 VITALS — BP 118/78 | Temp 98.1°F | Resp 16 | Ht 63.0 in | Wt 143.0 lb

## 2024-12-19 DIAGNOSIS — M85852 Other specified disorders of bone density and structure, left thigh: Secondary | ICD-10-CM | POA: Diagnosis not present

## 2024-12-19 LAB — BASIC METABOLIC PANEL WITH GFR
BUN: 19 mg/dL (ref 6–23)
CO2: 26 meq/L (ref 19–32)
Calcium: 9.4 mg/dL (ref 8.4–10.5)
Chloride: 104 meq/L (ref 96–112)
Creatinine, Ser: 0.6 mg/dL (ref 0.40–1.20)
GFR: 98.26 mL/min
Glucose, Bld: 78 mg/dL (ref 70–99)
Potassium: 4.4 meq/L (ref 3.5–5.1)
Sodium: 139 meq/L (ref 135–145)

## 2024-12-19 LAB — VITAMIN D 25 HYDROXY (VIT D DEFICIENCY, FRACTURES): VITD: 41.93 ng/mL (ref 30.00–100.00)

## 2024-12-19 NOTE — Patient Instructions (Addendum)
 A few things to remember from today's visit:  Osteopenia of neck of left femur - Plan: DG Bone Density, VITAMIN D  25 Hydroxy (Vit-D Deficiency, Fractures)  Do not use My Chart to request refills or for acute issues that need immediate attention. If you send a my chart message, it may take a few days to be addressed, specially if I am not in the office.  Please be sure medication list is accurate. If a new problem present, please set up appointment sooner than planned today.

## 2024-12-19 NOTE — Progress Notes (Signed)
 "   Chief Complaint  Patient presents with   Medical Management of Chronic Issues    Patient wants to discuss osteoporosis medication and bone density     Discussed the use of AI scribe software for clinical note transcription with the patient, who gave verbal consent to proceed.  History of Present Illness Kristina [name] is a 59 year old female with PMHx significant for HLD who presents with questions about bone density. Hx of osteopenia. Last CPE 06/01/24.  She had a bone density test in 2021, which showed osteopenia, particularly in the left hip, but not osteoporosis. She is concerned about her bone health due to a family history of osteoporosis, specifically mentioning her maternal aunt who has osteoporosis and a significant spinal curvature.  She is not currently taking calcium  and vitamin D  supplements and does not engage in weight-bearing exercises.  Results:   Lumbar spine L1-L4 Femoral neck (FN) 33% distal radius  T-score -0.1 RFN: -0.8 LFN: -1.2 n/a  Change in BMD from previous DXA test (%) n/a n/a n/a  (*) statistically significant   Assessment: the BMD is low according to the [facility] classification for osteoporosis (see below). Fracture risk: moderate FRAX score: 10 year major osteoporotic risk: 2.7%. 10 year hip fracture risk: 0.2%.   Review of Systems  Constitutional:  Negative for activity change and appetite change.  HENT:  Negative for sore throat.   Respiratory:  Negative for cough and shortness of breath.   Cardiovascular:  Negative for chest pain, palpitations and leg swelling.  Gastrointestinal:  Negative for abdominal pain, nausea and vomiting.  Endocrine: Negative for cold intolerance and heat intolerance.  Genitourinary:  Negative for decreased urine volume and hematuria.  Musculoskeletal:  Negative for gait problem and myalgias.  Skin:  Negative for rash.  Neurological:  Negative for syncope, weakness and headaches.  See other pertinent positives and  negatives in HPI.  Medications Ordered Prior to Encounter[1]  Past Medical History:  Diagnosis Date   Anemia    history   Anxiety    no meds   Dysrhythmia    HX -pt states she wore a holter in distant past for pvc-told d/t caffeine intake   GERD (gastroesophageal reflux disease)    occ - otc prn   Headache(784.0)    otc med prn   HSV infection    Ovarian cyst, right    PONV (postoperative nausea and vomiting)     Allergies[2]  Social History   Socioeconomic History   Marital status: Married    Spouse name: Not on file   Number of children: 0   Years of education: Not on file   Highest education level: Master's degree (e.g., MA, MS, MEng, MEd, MSW, MBA)  Occupational History   Not on file  Tobacco Use   Smoking status: Former    Current packs/day: 0.00    Average packs/day: 0.3 packs/day for 10.0 years (2.5 ttl pk-yrs)    Types: Cigarettes    Start date: 01/21/1983    Quit date: 01/21/1993    Years since quitting: 31.9   Smokeless tobacco: Never  Substance and Sexual Activity   Alcohol use: Not Currently    Alcohol/week: 14.0 standard drinks of alcohol    Types: 14 Shots of liquor per week    Comment: 06/26/21 stopped , has at least glass of wine every day; whiskey daily   Drug use: No   Sexual activity: Yes    Partners: Male    Birth control/protection:  Surgical    Comment: last week  Other Topics Concern   Not on file  Social History Narrative   Lives with spouse   Occas coffee   Social Drivers of Health   Tobacco Use: Medium Risk (12/19/2024)   Patient History    Smoking Tobacco Use: Former    Smokeless Tobacco Use: Never    Passive Exposure: Not on file  Financial Resource Strain: Not on file  Food Insecurity: Low Risk (08/14/2023)   Received from [facility]   Epic    Within the past 12 months, you worried that your food would run out before you got money to buy more: Never true    Within the past 12 months, the food you bought just didn't last  and you didn't have money to get more. : Never true  Transportation Needs: Not on file  Physical Activity: Not on file  Stress: Not on file  Social Connections: Not on file  Depression (PHQ2-9): Low Risk (06/01/2024)   Depression (PHQ2-9)    PHQ-2 Score: 0  Alcohol Screen: Not on file  Housing: Low Risk (08/14/2023)   Received from [facility]   Epic    What is your living situation today?: I have a steady place to live    Think about the place you live. Do you have problems with any of the following? Choose all that apply:: Not on file  Utilities: Not on file  Health Literacy: Not on file    Today's Vitals   12/19/24 0844  BP: 118/78  Resp: 16  Temp: 98.1 F (36.7 C)  Weight: 143 lb (64.9 kg)  Height: 5' 3 (1.6 m)   Body mass index is 25.33 kg/m.  Physical Exam Vitals and nursing note reviewed.  Constitutional:      General: She is not in acute distress.    Appearance: She is well-developed.  HENT:     Head: Normocephalic and atraumatic.  Eyes:     Conjunctiva/sclera: Conjunctivae normal.  Cardiovascular:     Rate and Rhythm: Normal rate and regular rhythm.     Pulses:          Posterior tibial pulses are 2+ on the right side and 2+ on the left side.     Heart sounds: No murmur heard. Pulmonary:     Effort: Pulmonary effort is normal. No respiratory distress.     Breath sounds: Normal breath sounds.  Abdominal:     Palpations: Abdomen is soft. There is no hepatomegaly or mass.     Tenderness: There is no abdominal tenderness.  Musculoskeletal:     Right lower leg: No edema.     Left lower leg: No edema.  Skin:    General: Skin is warm.     Findings: No erythema or rash.  Neurological:     General: No focal deficit present.     Mental Status: She is alert and oriented to person, place, and time.     Gait: Gait normal.  Psychiatric:        Mood and Affect: Mood and affect normal.    ASSESSMENT AND PLAN:  [name] was seen today for medical  management of chronic issues.  Diagnoses and all orders for this visit:  Orders Placed This Encounter  Procedures   DG Bone Density   VITAMIN D  25 Hydroxy (Vit-D Deficiency, Fractures)   Basic metabolic panel with GFR   Lab Results  Component Value Date   NA 139 12/19/2024  CL 104 12/19/2024   K 4.4 12/19/2024   CO2 26 12/19/2024   BUN 19 12/19/2024   CREATININE 0.60 12/19/2024   GFR 98.26 12/19/2024   CALCIUM  9.4 12/19/2024   ALBUMIN 4.5 03/06/2020   GLUCOSE 78 12/19/2024   Lab Results  Component Value Date   VD25OH 41.93 12/19/2024   Osteopenia of neck of left femur Assessment & Plan: During OV we reviewed DEXA done in 10/2020. After visit, we received copy of a more recent DEXA done on 01/05/24, which showed osteopenia left hip T score -1.4 and with FRAX score major osteoporotic fracture 3.4% and  hip fracture 0.3% , so cancelled DEXA ordered today. During visit we discussed current recommendations about osteopenia and osteoporosis management. She is not on calcium  or vit D supplementation, recommended 1200 mg and 800 U daily respectively. Regular physical activity that includes weightbearing exercises encouraged. Continue fall prevention. Further recommendations according to lab results. DEXA can be repeated in 12/2025.  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 Trying to renew her license, she is not sure which labs and vaccine she may need. She will let us  know and will come back for labs and/or nurse visit if needed.  I personally spent a total of 31 minutes in the care of the patient today including preparing to see the patient, getting/reviewing separately obtained history, performing a medically appropriate exam/evaluation, counseling and educating, placing orders, documenting clinical information in the EHR,  and independently interpreting results.  Return if symptoms worsen or fail to improve, for keep next appointment.  Shemika Robbs, MD [facility]. [place] [facility].      [1]  Current Outpatient Medications on File Prior to Visit  Medication Sig Dispense Refill   rosuvastatin  (CRESTOR ) 5 MG tablet Take 1 tablet (5 mg total) by mouth daily. 90 tablet 3   tirzepatide (ZEPBOUND) 12.5 MG/0.5ML Pen Inject 12.5 mg into the skin once a week.     Upadacitinib (RINVOQ PO) Take by mouth.     No current facility-administered medications on file prior to visit.  [2]  Allergies Allergen Reactions   Fluconazole Other (See Comments)    Other   Metronidazole Other (See Comments)    headache   Percocet [Oxycodone -Acetaminophen ] Nausea And Vomiting    out of her mind   Tetracyclines & Related Nausea Only   "

## 2024-12-19 NOTE — Assessment & Plan Note (Signed)
 During OV we reviewed DEXA done in 10/2020. After visit, we received copy of a more recent DEXA done on 01/05/24, which showed osteopenia left hip T score -1.4 and with FRAX score major osteoporotic fracture 3.4% and  hip fracture 0.3% , so cancelled DEXA ordered today. During visit we discussed current recommendations about osteopenia and osteoporosis management. She is not on calcium  or vit D supplementation, recommended 1200 mg and 800 U daily respectively. Regular physical activity that includes weightbearing exercises encouraged. Continue fall prevention. Further recommendations according to lab results. DEXA can be repeated in 12/2025.

## 2025-01-13 ENCOUNTER — Encounter: Payer: Self-pay | Admitting: Family Medicine

## 2025-01-13 ENCOUNTER — Ambulatory Visit: Admitting: Family Medicine

## 2025-01-13 VITALS — BP 122/70 | HR 88 | Temp 98.5°F | Resp 16 | Ht 63.0 in | Wt 143.0 lb

## 2025-01-13 DIAGNOSIS — Z111 Encounter for screening for respiratory tuberculosis: Secondary | ICD-10-CM

## 2025-01-13 DIAGNOSIS — Z0289 Encounter for other administrative examinations: Secondary | ICD-10-CM

## 2025-01-13 DIAGNOSIS — Z23 Encounter for immunization: Secondary | ICD-10-CM

## 2025-01-13 NOTE — Progress Notes (Unsigned)
 "   Chief Complaint  Patient presents with   Acute Visit    Wants to talk about vaccinations that are needed for work    Discussed the use of AI scribe software for clinical note transcription with the patient, who gave verbal consent to proceed.  History of Present Illness Kristina [name] is a 59 year old female who presents for vaccination updates and tuberculosis screening for credentialing purposes.  She requires hepatitis B, MMR, and flu vaccinations, as well as a TB screen. She prefers the TB blood test to avoid returning for a follow-up visit.  She has a history of receiving the MMR vaccine as a child. She believes she has antibodies most likely due to taking cequa paxlovid at age 80 and being taken off it.  She mentions a previous bone density test ordered by her gynecologist, which was recently completed, and thus another test is not covered by insurance at this time.  She discusses her past use of Wegovy  for weight loss, which was previously expensive but is now more affordable due to recent changes in pricing. She is currently on Zepbound, which she manages by using larger vials and splitting doses to reduce costs.  Varicella zoster antibody, IgG Order: [id number]    Component Ref Range & Units 4 yr ago  Varicella IgG  index 1,796.00  Comment:        Index               Interpretation      ---------         ----------------------     <135.00            Negative - Antibody not detected     135.00 - 164.99    Equivocal     > or = 165.00      Positive - Antibody detected     Rubella 9.42  Comment:     Index            Interpretation     -----            --------------       <0.90            Not consistent with immunity     0.90-0.99        Equivocal     > or = 1.00      Consistent with immunity   Mumps IgG 121.00  Comment:  AU/mL           Interpretation -------         ---------------- <9.00             Not consistent with immunity 9.00-10.99         Equivocal >10.99            Consistent with immunity   Rubeola IgG 18.60  Comment: AU/mL            Interpretation -----            -------------- <13.50           Not consistent with immunity 13.50-16.49      Equivocal >16.49           Consistent with immunity   Review of Systems  Constitutional:  Negative for activity change, appetite change, chills and fever.  HENT:  Negative for sore throat.   Respiratory:  Negative for cough and shortness of breath.   Cardiovascular:  Negative for chest pain and leg swelling.  Gastrointestinal:  Negative for abdominal pain, nausea and vomiting.  Genitourinary:  Negative for decreased urine volume, dysuria and hematuria.  Skin:  Negative for rash.  Neurological:  Negative for syncope, weakness and headaches.  See other pertinent positives and negatives in HPI.  Medications Ordered Prior to Encounter[1]  Past Medical History:  Diagnosis Date   Anemia    history   Anxiety    no meds   Dysrhythmia    HX -pt states she wore a holter in distant past for pvc-told d/t caffeine intake   GERD (gastroesophageal reflux disease)    occ - otc prn   Headache(784.0)    otc med prn   HSV infection    Ovarian cyst, right    PONV (postoperative nausea and vomiting)    Allergies[2]  Social History   Socioeconomic History   Marital status: Married    Spouse name: Not on file   Number of children: 0   Years of education: Not on file   Highest education level: Master's degree (e.g., MA, MS, MEng, MEd, MSW, MBA)  Occupational History   Not on file  Tobacco Use   Smoking status: Former    Current packs/day: 0.00    Average packs/day: 0.3 packs/day for 10.0 years (2.5 ttl pk-yrs)    Types: Cigarettes    Start date: 01/21/1983    Quit date: 01/21/1993    Years since quitting: 32.0   Smokeless tobacco: Never  Substance and Sexual Activity   Alcohol use: Not Currently    Alcohol/week: 14.0 standard drinks of alcohol    Types: 14 Shots of liquor  per week    Comment: 06/26/21 stopped , has at least glass of wine every day; whiskey daily   Drug use: No   Sexual activity: Yes    Partners: Male    Birth control/protection: Surgical    Comment: last week  Other Topics Concern   Not on file  Social History Narrative   Lives with spouse   Occas coffee   Social Drivers of Health   Tobacco Use: Medium Risk (01/13/2025)   Patient History    Smoking Tobacco Use: Former    Smokeless Tobacco Use: Never    Passive Exposure: Not on Actuary Strain: Not on file  Food Insecurity: Low Risk (08/14/2023)   Received from [facility]   Epic    Within the past 12 months, you worried that your food would run out before you got money to buy more: Never true    Within the past 12 months, the food you bought just didn't last and you didn't have money to get more. : Never true  Transportation Needs: Not on file  Physical Activity: Not on file  Stress: Not on file  Social Connections: Not on file  Depression (PHQ2-9): Low Risk (06/01/2024)   Depression (PHQ2-9)    PHQ-2 Score: 0  Alcohol Screen: Not on file  Housing: Low Risk (08/14/2023)   Received from [facility]   Epic    What is your living situation today?: I have a steady place to live    Think about the place you live. Do you have problems with any of the following? Choose all that apply:: Not on file  Utilities: Not on file  Health Literacy: Not on file    Vitals:   01/13/25 1338  BP: 122/70  Pulse: 88  Resp: 16  Temp: 98.5 F (36.9 C)  SpO2: 100%   Body mass index  is 25.33 kg/m.  Physical Exam Vitals and nursing note reviewed.  Constitutional:      General: She is not in acute distress.    Appearance: She is well-developed.  HENT:     Head: Normocephalic and atraumatic.  Eyes:     Conjunctiva/sclera: Conjunctivae normal.  Cardiovascular:     Rate and Rhythm: Normal rate and regular rhythm.     Heart sounds: No murmur heard. Pulmonary:     Effort:  Pulmonary effort is normal. No respiratory distress.     Breath sounds: Normal breath sounds.  Abdominal:     Palpations: Abdomen is soft. There is no mass.     Tenderness: There is no abdominal tenderness.  Skin:    General: Skin is warm.     Findings: No erythema or rash.  Neurological:     General: No focal deficit present.     Mental Status: She is alert and oriented to person, place, and time.     Gait: Gait normal.  Psychiatric:        Mood and Affect: Mood and affect normal.   ASSESSMENT AND PLAN:  Zamia was seen today for acute visit.  Diagnoses and all orders for this visit:  Encounter for physical examination related to employment  Screening-pulmonary TB -     QuantiFERON-TB Gold Plus; Future -     QuantiFERON-TB Gold Plus  Immunization due -     Flu vaccine trivalent PF, 6mos and older(Flulaval,Afluria,Fluarix,Fluzone) -     Heplisav-B (HepB-CPG) Vaccine    Orders Placed This Encounter  Procedures   Flu vaccine trivalent PF, 6mos and older(Flulaval,Afluria,Fluarix,Fluzone)   Heplisav-B (HepB-CPG) Vaccine   QuantiFERON-TB Gold Plus    No problem-specific Assessment & Plan notes found for this encounter.  We reviewed prior records and in 2019 she received Hep B x 2, did not have the 3rd dose. We do not have same vaccine she received in 2019, so she received one more hep B vaccine today. MMR and varicella ab confirmed 4 years ago, so copy of labs provided to be attached to employment form. Influenza vaccine given today.  Return if symptoms worsen or fail to improve, for keep next appointment.  Floyde Dingley G. Coe Angelos, MD  [facility]. [place] office.     [1]  Current Outpatient Medications on File Prior to Visit  Medication Sig Dispense Refill   rosuvastatin  (CRESTOR ) 5 MG tablet Take 1 tablet (5 mg total) by mouth daily. 90 tablet 3   tirzepatide (ZEPBOUND) 12.5 MG/0.5ML Pen Inject 12.5 mg into the skin once a week.     Upadacitinib (RINVOQ PO)  Take by mouth.     No current facility-administered medications on file prior to visit.  [2]  Allergies Allergen Reactions   Fluconazole Other (See Comments)    Other   Metronidazole Other (See Comments)    headache   Percocet [Oxycodone -Acetaminophen ] Nausea And Vomiting    out of her mind   Tetracyclines & Related Nausea Only   "

## 2025-06-05 ENCOUNTER — Encounter: Admitting: Family Medicine

## 2025-06-05 ENCOUNTER — Encounter: Admitting: Internal Medicine

## 2025-06-13 ENCOUNTER — Encounter: Admitting: Family Medicine
